# Patient Record
Sex: Female | Born: 1970 | Race: White | Hispanic: No | Marital: Married | Smoking: Former smoker
Health system: Southern US, Community
[De-identification: ages and names within clinical notes are randomized; demographics above are authoritative.]

## PROBLEM LIST (undated history)

## (undated) DIAGNOSIS — R002 Palpitations: Secondary | ICD-10-CM

## (undated) DIAGNOSIS — I10 Essential (primary) hypertension: Secondary | ICD-10-CM

## (undated) DIAGNOSIS — I340 Nonrheumatic mitral (valve) insufficiency: Secondary | ICD-10-CM

## (undated) DIAGNOSIS — K4090 Unilateral inguinal hernia, without obstruction or gangrene, not specified as recurrent: Secondary | ICD-10-CM

## (undated) DIAGNOSIS — I499 Cardiac arrhythmia, unspecified: Secondary | ICD-10-CM

## (undated) HISTORY — DX: Cardiac arrhythmia, unspecified: I49.9

## (undated) HISTORY — PX: DILATION AND CURETTAGE OF UTERUS: SHX78

## (undated) HISTORY — PX: INTRAUTERINE DEVICE (IUD) INSERTION: SHX5877

## (undated) HISTORY — PX: TONSILLECTOMY: SUR1361

---

## 2001-02-15 ENCOUNTER — Other Ambulatory Visit: Admission: RE | Admit: 2001-02-15 | Discharge: 2001-02-15 | Payer: Self-pay | Admitting: Obstetrics & Gynecology

## 2001-09-01 ENCOUNTER — Inpatient Hospital Stay (HOSPITAL_COMMUNITY): Admission: AD | Admit: 2001-09-01 | Discharge: 2001-09-05 | Payer: Self-pay | Admitting: Obstetrics and Gynecology

## 2001-09-01 ENCOUNTER — Encounter (INDEPENDENT_AMBULATORY_CARE_PROVIDER_SITE_OTHER): Payer: Self-pay

## 2001-10-14 ENCOUNTER — Other Ambulatory Visit: Admission: RE | Admit: 2001-10-14 | Discharge: 2001-10-14 | Payer: Self-pay | Admitting: Obstetrics & Gynecology

## 2002-09-19 ENCOUNTER — Other Ambulatory Visit: Admission: RE | Admit: 2002-09-19 | Discharge: 2002-09-19 | Payer: Self-pay | Admitting: Obstetrics & Gynecology

## 2003-04-06 ENCOUNTER — Inpatient Hospital Stay (HOSPITAL_COMMUNITY): Admission: AD | Admit: 2003-04-06 | Discharge: 2003-04-09 | Payer: Self-pay | Admitting: Obstetrics & Gynecology

## 2003-05-04 ENCOUNTER — Other Ambulatory Visit: Admission: RE | Admit: 2003-05-04 | Discharge: 2003-05-04 | Payer: Self-pay | Admitting: Obstetrics & Gynecology

## 2005-12-25 ENCOUNTER — Ambulatory Visit (HOSPITAL_COMMUNITY): Admission: RE | Admit: 2005-12-25 | Discharge: 2005-12-25 | Payer: Self-pay | Admitting: Obstetrics & Gynecology

## 2008-06-24 HISTORY — PX: OTHER SURGICAL HISTORY: SHX169

## 2009-11-06 HISTORY — PX: MYOMECTOMY: SHX85

## 2010-07-12 ENCOUNTER — Ambulatory Visit (HOSPITAL_COMMUNITY): Admission: RE | Admit: 2010-07-12 | Discharge: 2010-07-12 | Payer: Self-pay | Admitting: Obstetrics & Gynecology

## 2010-08-16 ENCOUNTER — Ambulatory Visit (HOSPITAL_COMMUNITY): Admission: RE | Admit: 2010-08-16 | Discharge: 2010-08-16 | Payer: Self-pay | Admitting: Obstetrics & Gynecology

## 2011-01-19 LAB — CBC
HCT: 36.6 % (ref 36.0–46.0)
RBC: 3.96 MIL/uL (ref 3.87–5.11)
RDW: 12.1 % (ref 11.5–15.5)
WBC: 8.6 10*3/uL (ref 4.0–10.5)

## 2011-01-19 LAB — BASIC METABOLIC PANEL
CO2: 21 mEq/L (ref 19–32)
Calcium: 7.6 mg/dL — ABNORMAL LOW (ref 8.4–10.5)
GFR calc Af Amer: >60 mL/min (ref >60)
GFR calc non Af Amer: >60 mL/min (ref >60)
Sodium: 137 mEq/L (ref 135–145)

## 2011-03-24 NOTE — H&P (Signed)
NAME:  Natalie Hooper, BARBATO                           ACCOUNT NO.:  [id number]   MEDICAL RECORD NO.:  [id number]                   PATIENT TYPE:  INP   LOCATION:  9167                                 FACILITY:  [facility]   PHYSICIAN:  Genia Del, M.D.             DATE OF BIRTH:  May 26, 1971   DATE OF ADMISSION:  04/06/2003  DATE OF DISCHARGE:                                HISTORY & PHYSICAL   HISTORY AND PHYSICAL EXAMINATION:  Mrs. Sutphin is a 40 year old, G2, P36,  estimated date of delivery April 16, 2003, at 38 weeks and 4 days gestation.   REASON FOR ADMISSION:  Regular uterine contractions every three to five  minutes starting this evening.   HISTORY OF PRESENT ILLNESS:  Increasing uterine contractions, questionable  fluid leak with Nitrazine negative and fern negative at maternity  admissions.  Fetal movements positive.  No vaginal bleeding.  No PIH  symptoms.   PAST MEDICAL HISTORY:  Mild migraines.   PAST SURGICAL HISTORY:  Negative.   PAST OBSTETRICAL HISTORY:  In October of 2002, 35+ weeks premature rupture  of membranes, spontaneous vaginal delivery of a baby boy, 5 pounds 10  ounces, no complications.   ALLERGIES:  No known drug allergies.   MEDICATIONS:  Prenatal vitamins.   FAMILY HISTORY:  Positive for cardiovascular disease, chronic hypertension,  and diabetes mellitus.   SOCIAL HISTORY:  Married.  Nonsmoker.   HISTORY OF PRESENT PREGNANCY:  The first trimester was normal.  Labs showed  a hemoglobin of 12.7 and platelets 258.  Blood type Rh O positive.  Antibodies negative.  RPR nonreactive.  HBSAG negative.  HIV nonreactive.  Rubella titer is immune.  GC and chlamydia negative.  Pap test normal.  At  the end of the second trimester, the triple test showed an increased alpha-  fetoprotein.  A level 2 ultrasound review of anatomy was within normal  limits.  The patient declined amniocentesis.  In the third trimester, one-  hour GTT was within normal limits.   Ultrasound for interval growth showed a  65th percentile at 31+ weeks.  NSTs were done each visit and were reactive  for unexplained increased alpha-fetoprotein.  The patient had preterm  uterine contractions and was put on relative bed rest after 33+ weeks.  Betamethasone was given.  Fetal fibronectins were negative.  Group B  Streptococcus was negative.  Blood pressures were borderline at the end of  pregnancy in the 130s/80s.  No evidence of preeclampsia.  Urine was negative  for protein.   REVIEW OF SYSTEMS:  CONSTITUTIONAL:  Negative.  HEENT:  Negative.  CARDIOVASCULAR:  Negative.  RESPIRATORY:  Negative.  GASTROINTESTINAL:  Negative.  GENITOURINARY:  Negative.  NEUROLOGIC:  Negative.  DERMATOLOGIC:  Negative.  ENDOCRINE:  Negative.   PHYSICAL EXAMINATION:  GENERAL APPEARANCE:  No apparent distress.  Mild pain  with uterine contractions.  VITAL SIGNS:  Blood pressure around 130s/low  90s, afebrile, respiratory rate  20, pulse regular and normal.  LUNGS:  Clear bilaterally.  HEART:  Regular cardiac rhythm.  ABDOMEN:  Gravid.  Cephalic presentation.  PELVIC:  3 cm, 70%, vertex, -1, fixed.  Membranes intact.  EXTREMITIES:  Lower limbs with no edema.  NEUROLOGIC:  DTRs 2/4 bilaterally.  No clonus.   MONITORING:  Fetal heart rate 140s with good accelerations and no  decelerations.  Uterine contractions every three to four minutes, mild,  lasting 50 seconds.   IMPRESSION:  1. Gravida 2, para 1 at 38+ weeks gestation.  2. Group B Streptococcus negative.  3. Fetal well being reassuring.  4. In early spontaneous labor.   PLAN:  1. Admit to labor and delivery.  2. Artificial rupture of membranes.  3. Monitoring.  4. Expectant management towards probable vaginal delivery.                                               Genia Del, M.D.    ML/MEDQ  D:  04/06/2003  T:  04/06/2003  Job:  [id number]

## 2011-03-24 NOTE — H&P (Signed)
[facility]  Patient:    Natalie Hooper, Natalie Hooper Visit Number: [id number] MRN: [id number]          Service Type: Attending:  Silverio Lay, M.D. Dictated by:   Silverio Lay, M.D. Adm. Date:  09/02/01                           History and Physical  REASON FOR ADMISSION:         Intrauterine pregnancy at 35 weeks and 4 days with spontaneous rupture of membranes.  HISTORY OF PRESENT ILLNESS:   This is a 40 year old married white female, gravida 1, para 0, with a due date of October 03, 2001, being admitted at 35 weeks and 4 days with spontaneous rupture of membranes of clear fluid on September 01, 2001 at 11 oclock p.m.  She denies any uterine contractions, reports good fetal activity, denies any bleeding and denies any pregnancy-induced hypertension symptoms.  Prenatal course reveals blood type O-positive, RPR nonreactive, rubella immune, HBsAg negative, HIV nonreactive, gonorrhea negative, Chlamydia negative, Pap smear within normal limits. Sixteen-week AFP was elevated, borderline, at 2.34.  Twenty-week ultrasound revealed a normal anatomy survey, normal cervical length and posterior placenta.  One-hour glucose tolerance test was within normal limits.  Prenatal course was otherwise uneventful.  ALLERGIES:                    No known drug allergies.  PAST MEDICAL HISTORY:         History of migraines.  T&A at age 19.  FAMILY HISTORY:               Maternal grandmother, maternal grandfather, paternal grandfather and mother with hypertension.  Maternal grandfather with diabetes.  Maternal aunt with diabetes.  SOCIAL HISTORY:               Married, nonsmoker, works as a Production designer, theatre/television/film.  PHYSICAL EXAMINATION:  VITAL SIGNS:                  Blood pressure is 130s-140s/95.  HEENT:                        Negative.  LUNGS:                        Clear.  HEART:                        Normal.  ABDOMEN:                      Gravid, nontender.  Fundal height  adequate for dates.  Fetal heart rate tracings are reactive.  Contractions are irregular and not perceived by the patient.  PELVIC:                       Vaginal exam by admitting nurse is fingertip, 70% effaced, vertex -1.  EXTREMITIES:                  No edema.  Deep tendon reflexes 2/4, no clonus.  ASSESSMENT:                   Intrauterine pregnancy at 35 weeks and 4 days with spontaneous rupture of membranes, group B streptococcus unknown, pending, no evidence of active labor, elevation of high blood pressure without symptoms of pregnancy-induced  hypertension.  PLAN:                         Patient will be admitted to labor and delivery. I have reviewed with patient two options as far as expectant management with risks and benefits and induction of labor with risks and benefits.  Patient is well aware that group B strep is unknown and desires expectant management.  PIH labs will be drawn. Dictated by:   Silverio Lay, M.D. Attending:  Silverio Lay, M.D. DD:  09/02/01 TD:  09/02/01 Job: [id number]

## 2011-05-18 ENCOUNTER — Ambulatory Visit: Payer: Self-pay | Admitting: Cardiovascular Disease

## 2011-05-22 HISTORY — PX: DOBUTAMINE STRESS ECHO: SHX5426

## 2011-07-31 ENCOUNTER — Other Ambulatory Visit (HOSPITAL_COMMUNITY): Payer: Self-pay | Admitting: Obstetrics & Gynecology

## 2011-07-31 DIAGNOSIS — Z1231 Encounter for screening mammogram for malignant neoplasm of breast: Secondary | ICD-10-CM

## 2011-09-14 ENCOUNTER — Ambulatory Visit (HOSPITAL_COMMUNITY)
Admission: RE | Admit: 2011-09-14 | Discharge: 2011-09-14 | Disposition: A | Discharge status: Home / Self Care | Payer: BC Managed Care – PPO | Source: Ambulatory Visit | Attending: Obstetrics & Gynecology | Admitting: Obstetrics & Gynecology

## 2011-09-14 DIAGNOSIS — Z1231 Encounter for screening mammogram for malignant neoplasm of breast: Secondary | ICD-10-CM

## 2011-10-03 HISTORY — PX: DOPPLER ECHOCARDIOGRAPHY: SHX263

## 2013-03-12 ENCOUNTER — Other Ambulatory Visit (HOSPITAL_COMMUNITY): Payer: Self-pay | Admitting: Obstetrics & Gynecology

## 2013-03-12 DIAGNOSIS — Z1231 Encounter for screening mammogram for malignant neoplasm of breast: Secondary | ICD-10-CM

## 2013-04-02 ENCOUNTER — Ambulatory Visit (HOSPITAL_COMMUNITY)
Admission: RE | Admit: 2013-04-02 | Discharge: 2013-04-02 | Disposition: A | Discharge status: Home / Self Care | Payer: BC Managed Care – PPO | Source: Ambulatory Visit | Attending: Obstetrics & Gynecology | Admitting: Obstetrics & Gynecology

## 2013-04-02 DIAGNOSIS — Z1231 Encounter for screening mammogram for malignant neoplasm of breast: Secondary | ICD-10-CM

## 2013-11-21 ENCOUNTER — Other Ambulatory Visit: Payer: Self-pay | Admitting: Cardiovascular Disease

## 2013-12-10 ENCOUNTER — Encounter: Payer: Self-pay | Admitting: Cardiovascular Disease

## 2013-12-10 ENCOUNTER — Ambulatory Visit (INDEPENDENT_AMBULATORY_CARE_PROVIDER_SITE_OTHER): Payer: BC Managed Care – PPO | Admitting: Cardiovascular Disease

## 2013-12-10 VITALS — BP 128/86 | HR 68 | Ht 62.0 in | Wt 115.5 lb

## 2013-12-10 DIAGNOSIS — R5383 Other fatigue: Secondary | ICD-10-CM

## 2013-12-10 DIAGNOSIS — R002 Palpitations: Secondary | ICD-10-CM

## 2013-12-10 DIAGNOSIS — E782 Mixed hyperlipidemia: Secondary | ICD-10-CM

## 2013-12-10 DIAGNOSIS — Z79899 Other long term (current) drug therapy: Secondary | ICD-10-CM

## 2013-12-10 DIAGNOSIS — I059 Rheumatic mitral valve disease, unspecified: Secondary | ICD-10-CM

## 2013-12-10 DIAGNOSIS — R5381 Other malaise: Secondary | ICD-10-CM

## 2013-12-10 DIAGNOSIS — I340 Nonrheumatic mitral (valve) insufficiency: Secondary | ICD-10-CM

## 2013-12-10 DIAGNOSIS — Z8249 Family history of ischemic heart disease and other diseases of the circulatory system: Secondary | ICD-10-CM

## 2013-12-10 NOTE — Patient Instructions (Signed)
Your physician recommends that you schedule a follow-up appointment in: 1 year  Your physician recommends that you return for lab work CBC, CMP, TSH, FASTING LIPIDS

## 2013-12-10 NOTE — Progress Notes (Signed)
Patient ID: Natalie Hooper, female   DOB: 03-Feb-1971, 43 y.o.   MRN: [id number]     HPI: Natalie Hooper is a 43 y.o. female who presents for one-year cardiology evaluation.  As the MRN is very pleasant 43 year old female who is the daughter of my patient Natalie Hooper. I last saw Natalie Hooper one year ago. History of palpitations an echo Doppler study November 2012 showed normal systolic and diastolic function. She had flat mitral valve coaptation without definitive prolapse and mild mitral regurgitation with trivial tricuspid regurgitation. She had normal pulmonary pressures.  Over the past year, she has been on metoprolol succinate at 37.5 mg daily. This has essentially controlled her palpitations. There is a very rare instance where she does note an occasional palpitation and she has taken an extra half of this metoprolol.  She continues to be active. She denies chest pain. She denies shortness of breath. She denies presyncope or syncope. She exercises regularly. She voids caffeine as possible.  No past medical history on file.  No past surgical history on file.  No Known Allergies  Current Outpatient Prescriptions  Medication Sig Dispense Refill  . acetaminophen (TYLENOL) 325 MG tablet Take 650 mg by mouth as needed.      Marland Kitchen ibuprofen (ADVIL,MOTRIN) 200 MG tablet Take 200 mg by mouth as needed.      . metoprolol succinate (TOPROL-XL) 25 MG 24 hr tablet Take 1.5 tablets (37.5 mg total) by mouth daily. Keep appointment for refills.  135 tablet  0   No current facility-administered medications for this visit.    History   Social History  . Marital Status: Married    Spouse Name: N/A    Number of Children: N/A  . Years of Education: N/A   Occupational History  . Not on file.   Social History Main Topics  . Smoking status: Former Smoker -- 5 years    Types: Cigarettes    Quit date: 12/10/1996  . Smokeless tobacco: Never Used  . Alcohol Use: Yes     Comment: occas  . Drug Use: No    . Sexual Activity: Not on file   Other Topics Concern  . Not on file   Social History Narrative  . No narrative on file   Socially she is married and has 2 children, ages 56 and 1. There is no tobacco use. She does drink occasional alcohol.  Family History  Problem Relation Age of Onset  . Heart disease Mother 58  . Hypertension Maternal Grandmother   . Hypertension Maternal Grandfather   . Diabetes Maternal Grandfather   . Heart disease Maternal Grandfather 55    ROS is negative for fevers, chills or night sweats. She denies changes in vision or hearing. There is no lymphadenopathy. She denies wheezing, cough, increased sputum production. She denies recent presyncope. She does not wear palpitation. There is no chest pain. She denies nausea vomiting diarrhea. She denies GU symptoms. She denies blood in stool. There are no myalgias. She denies edema. There is no diabetes or thyroid abnormality. She denies issues with sleep.  Other comprehensive 14 point system review is negative.  PE BP 128/86  Pulse 68  Ht 5' 2"  (1.575 m)  Wt 115 lb 8 oz (52.39 kg)  BMI 21.12 kg/m2  General: Alert, oriented, no distress.  Skin: normal turgor, no rashes HEENT: Normocephalic, atraumatic. Pupils round and reactive; sclera anicteric;no lid lag. Extraocular muscles intact. Nose without nasal septal hypertrophy Mouth/Parynx benign; Mallinpatti scale 2  Neck: No JVD, no carotid bruits; normal carotid upstroke Lungs: clear to ausculatation and percussion; no wheezing or rales Chest wall: no tenderness to palpitation Heart: RRR, s1 s2 normal  1/6 systolic murmur intermittent systolic click. No S3 or S4 gallop. No rub, thrill, or heave. Abdomen: soft, nontender; no hepatosplenomehaly, BS+; abdominal aorta nontender and not dilated by palpation. Back: no CVA tenderness Pulses 2+ Extremities: no clubbing cyanosis or edema, Homan's sign negative  Neurologic: grossly nonfocal; cranial nerves grossly  normal. Psychologic: normal affect and mood.  ECG (independently read by me): Normal sinus rhythm at 68 beats per minute. Normal intervals. No ectopy  LABS:  BMET    Component Value Date/Time   NA 137 07/12/2010 1136   K 3.7 07/12/2010 1136   CL 110 07/12/2010 1136   CO2 21 07/12/2010 1136   GLUCOSE 81 07/12/2010 1136   BUN 8 07/12/2010 1136   CREATININE 0.66 07/12/2010 1136   CALCIUM 7.6* 07/12/2010 1136   GFRNONAA >60 07/12/2010 1136   GFRAA  Value: >60        The eGFR has been calculated using the MDRD equation. This calculation has not been validated in all clinical situations. eGFR's persistently <60 mL/min signify possible Chronic Kidney Disease. 07/12/2010 1136     Hepatic Function Panel  No results found for this basename: prot, albumin, ast, alt, alkphos, bilitot, bilidir, ibili     CBC    Component Value Date/Time   WBC 8.6 07/12/2010 0905   RBC 3.96 07/12/2010 0905   HGB 12.6 07/12/2010 0905   HCT 36.6 07/12/2010 0905   PLT 318 07/12/2010 0905   MCV 92.5 07/12/2010 0905   MCH 31.8 07/12/2010 0905   MCHC 34.3 07/12/2010 0905   RDW 12.1 07/12/2010 0905     BNP No results found for this basename: probnp    Lipid Panel  No results found for this basename: chol, trig, hdl, cholhdl, vldl, ldlcalc     RADIOLOGY: No results found.    ASSESSMENT AND PLAN: Natalie Hooper is a very pleasant 43 year old female who has documented normal systolic and diastolic function with flat mitral valve closure and mild mitral regurgitation. She was not found to have definitive mitral valve prolapse. She does have an intermittent click on exam. She has done well on her current dose of metoprolol succinate at 37.5 mg. I advised her that she can increase this to 50 mg if she does note more frequent palpitations. She's not had blood work done since 2012. We will check a complete set of blood work in the fasting state. This will be done in [place]. If abnormalities are present we will contact her. Otherwise, so  she remains stable will see her in one year for cardiology reevaluation.     Troy Sine, MD, [facility]  12/10/2013 2:25 PM

## 2014-03-03 ENCOUNTER — Other Ambulatory Visit: Payer: Self-pay | Admitting: *Deleted

## 2014-03-03 MED ORDER — METOPROLOL SUCCINATE ER 25 MG PO TB24: 37.5000 mg | ORAL_TABLET | Freq: Every day | ORAL | Status: DC

## 2014-03-03 NOTE — Telephone Encounter (Signed)
Rx was sent to pharmacy electronically. 

## 2014-06-19 ENCOUNTER — Other Ambulatory Visit (HOSPITAL_COMMUNITY): Payer: Self-pay | Admitting: Obstetrics & Gynecology

## 2014-06-19 DIAGNOSIS — Z1231 Encounter for screening mammogram for malignant neoplasm of breast: Secondary | ICD-10-CM

## 2014-07-21 ENCOUNTER — Ambulatory Visit (HOSPITAL_COMMUNITY): Payer: BC Managed Care – PPO

## 2014-07-21 ENCOUNTER — Ambulatory Visit (HOSPITAL_COMMUNITY)
Admission: RE | Admit: 2014-07-21 | Discharge: 2014-07-21 | Disposition: A | Discharge status: Home / Self Care | Payer: BC Managed Care – PPO | Source: Ambulatory Visit | Attending: Obstetrics & Gynecology | Admitting: Obstetrics & Gynecology

## 2014-07-21 DIAGNOSIS — Z1231 Encounter for screening mammogram for malignant neoplasm of breast: Secondary | ICD-10-CM | POA: Diagnosis present

## 2014-12-28 ENCOUNTER — Encounter: Payer: Self-pay | Admitting: Cardiovascular Disease

## 2014-12-29 ENCOUNTER — Ambulatory Visit (INDEPENDENT_AMBULATORY_CARE_PROVIDER_SITE_OTHER): Payer: BC Managed Care – PPO | Admitting: Cardiovascular Disease

## 2014-12-29 ENCOUNTER — Encounter: Payer: Self-pay | Admitting: Cardiovascular Disease

## 2014-12-29 VITALS — BP 118/80 | HR 65 | Ht 62.0 in | Wt 123.6 lb

## 2014-12-29 DIAGNOSIS — Z8249 Family history of ischemic heart disease and other diseases of the circulatory system: Secondary | ICD-10-CM

## 2014-12-29 DIAGNOSIS — R002 Palpitations: Secondary | ICD-10-CM

## 2014-12-29 DIAGNOSIS — I340 Nonrheumatic mitral (valve) insufficiency: Secondary | ICD-10-CM

## 2014-12-29 DIAGNOSIS — I341 Nonrheumatic mitral (valve) prolapse: Secondary | ICD-10-CM

## 2014-12-29 NOTE — Patient Instructions (Signed)
Your physician has requested that you have an echocardiogram. Echocardiography is a painless test that uses sound waves to create images of your heart. It provides your doctor with information about the size and shape of your heart and how well your heart's chambers and valves are working. This procedure takes approximately one hour. There are no restrictions for this procedure. This will be done in  1 year.   Your physician wants you to follow-up in: 1 year or sooner if needed with Dr. Claiborne Billings. You will receive a reminder letter in the mail two months in advance. If you don't receive a letter, please call our office to schedule the follow-up appointment.

## 2014-12-29 NOTE — Progress Notes (Signed)
Patient ID: [name], female   DOB: December 11, 1970, 44 y.o.   MRN: [id number]     HPI: Natalie Hooper is a 44 y.o. female who presents for one-year cardiology evaluation. She is the daughter of my patient Natalie Hooper.  Natalie Hooper has a history of palpitations.  An echo Doppler study November 2012 showed normal systolic and diastolic function. She had flat mitral valve coaptation without definitive prolapse and mild mitral regurgitation with trivial tricuspid regurgitation. She had normal pulmonary pressures.  She has been on metoprolol succinate at 37.5 mg daily which has essentially controlled her palpitations. There is a very rare instance where she does note an occasional palpitation and she has taken an extra half of this metoprolol.  This usually occurs when she is resting under periods of increased stress.  She continues to be active.  She exercises daily.  She denies chest pain. She denies shortness of breath. She denies presyncope or syncope. She exercises regularly. She avoids caffeine as possible.  She has rare episodes of GERD for which he takes omeprazole with relief.  She tells me that she had a complete set of laboratory done by her primary physician, Natalie Hooper in [place] in the fall of 2015.  She presents for one-year evaluation.  Past Medical History  Diagnosis Date  . Arrhythmia     Hx of Palp.    Past Surgical History  Procedure Laterality Date  . Doppler echocardiography  10/03/2011    EF = >55%,mild mitral regurg  . Dobutamine stress echo  05/22/2011    Normal treadmill,normal stress echo  . Event monitor  06/24/2008    NSR,Sinus Tach    No Known Allergies  Current Outpatient Prescriptions  Medication Sig Dispense Refill  . acetaminophen (TYLENOL) 325 MG tablet Take 650 mg by mouth as needed.    Marland Kitchen ibuprofen (ADVIL,MOTRIN) 200 MG tablet Take 200 mg by mouth as needed.    . metoprolol succinate (TOPROL-XL) 25 MG 24 hr tablet Take 1.5 tablets (37.5 mg  total) by mouth daily. 135 tablet 3  . omeprazole (PRILOSEC) 20 MG capsule Take 1 capsule by mouth as needed.     No current facility-administered medications for this visit.    History   Social History  . Marital Status: Married    Spouse Name: N/A  . Number of Children: N/A  . Years of Education: N/A   Occupational History  . Not on file.   Social History Main Topics  . Smoking status: Former Smoker -- 5 years    Types: Cigarettes    Quit date: 12/10/1996  . Smokeless tobacco: Never Used  . Alcohol Use: Yes     Comment: occas  . Drug Use: No  . Sexual Activity: Not on file   Other Topics Concern  . Not on file   Social History Narrative   Socially she is married and has 2 children, ages 30 and 86. There is no tobacco use. She does drink occasional alcohol.  Family History  Problem Relation Age of Onset  . Heart disease Mother 39  . Hypertension Maternal Grandmother   . Hypertension Maternal Grandfather   . Diabetes Maternal Grandfather   . Heart disease Maternal Grandfather 55   ROS General: Negative; No fevers, chills, or night sweats;  HEENT: Positive for rare nosebleeds; No changes in vision or hearing, sinus congestion, difficulty swallowing Pulmonary: Negative; No cough, wheezing, shortness of breath, hemoptysis Cardiovascular: Negative; No chest pain, presyncope, syncope, palpitations GI:  Positive for mild GERD; No nausea, vomiting, diarrhea, or abdominal pain GU: Negative; No dysuria, hematuria, or difficulty voiding Musculoskeletal: Negative; no myalgias, joint pain, or weakness Hematologic/Oncology: Negative; no easy bruising, bleeding Endocrine: Negative; no heat/cold intolerance; no diabetes Neuro: Negative; no changes in balance, headaches Skin: Negative; No rashes or skin lesions Psychiatric: Negative; No behavioral problems, depression Sleep: Negative; No snoring, daytime sleepiness, hypersomnolence, bruxism, restless legs, hypnogognic  hallucinations, no cataplexy Other comprehensive 14 point system review is negative.   PE BP 118/80 mmHg  Pulse 65  Ht _0  (1.575 m)  Wt 123 lb 9.6 oz (56.065 kg)  BMI 22.60 kg/m2  General: Alert, oriented, no distress.  Skin: normal turgor, no rashes HEENT: Normocephalic, atraumatic. Pupils round and reactive; sclera anicteric;no lid lag. Extraocular muscles intact. Nose without nasal septal hypertrophy Mouth/Parynx benign; Mallinpatti scale 2 Neck: No JVD, no carotid bruits; normal carotid upstroke Lungs: clear to ausculatation and percussion; no wheezing or rales Chest wall: no tenderness to palpitation Heart: RRR, s1 s2 normal  1/6 systolic murmur intermittent systolic click. No S3 or S4 gallop. No rub, thrill, or heave. Abdomen: soft, nontender; no hepatosplenomehaly, BS+; abdominal aorta nontender and not dilated by palpation. Back: no CVA tenderness Pulses 2+ Extremities: no clubbing cyanosis or edema, Homan's sign negative  Neurologic: grossly nonfocal; cranial nerves grossly normal. Psychologic: normal affect and mood.  ECG (independently read by me): Normal sinus rhythm at 65 bpm.  No ectopy.  Normal intervals.  Mild RV conduction delay.  ECG (independently read by me): Normal sinus rhythm at 68 beats per minute. Normal intervals. No ectopy  LABS:  BMET    Component Value Date/Time   NA 137 07/12/2010 1136   K 3.7 07/12/2010 1136   CL 110 07/12/2010 1136   CO2 21 07/12/2010 1136   GLUCOSE 81 07/12/2010 1136   BUN 8 07/12/2010 1136   CREATININE 0.66 07/12/2010 1136   CALCIUM 7.6* 07/12/2010 1136   GFRNONAA >60 07/12/2010 1136   GFRAA  07/12/2010 1136    >60        The eGFR has been calculated using the MDRD equation. This calculation has not been validated in all clinical situations. eGFR's persistently <60 mL/min signify possible Chronic Kidney Disease.     Hepatic Function Panel  No results found for: PROT   CBC    Component Value Date/Time     WBC 8.6 07/12/2010 0905   RBC 3.96 07/12/2010 0905   HGB 12.6 07/12/2010 0905   HCT 36.6 07/12/2010 0905   PLT 318 07/12/2010 0905   MCV 92.5 07/12/2010 0905   MCH 31.8 07/12/2010 0905   MCHC 34.3 07/12/2010 0905   RDW 12.1 07/12/2010 0905     BNP No results found for: PROBNP  Lipid Panel  No results found for: CHOL   RADIOLOGY: No results found.    ASSESSMENT AND PLAN: Natalie Hooper is a very pleasant 44 year old female who has documented normal systolic and diastolic function with flat mitral valve closure and mild mitral regurgitation by an echo Doppler study in 2012.Marland Kitchen She was not found to have definitive mitral valve prolapse. She does have an intermittent click on exam and a faint systolic murmur suggestive of MR.  She has done well on her current dose of metoprolol succinate at 37.5 mg. I advised her that she can increase this to 50 mg if she does note more frequent palpitations.  She has had recent laboratory by Dr. Kary Kos.  I will try to  obtain these results.  Clinically, she is stable.  I will see her in one year for follow-up evaluation.  Prior to that office visit.  I am recommending that she undergo a five-year follow-up echo Doppler study to reassess her systolic and diastolic function as well as valvular architecture.  I'll see her in the office in follow-up and further recommendations will be made at that time.    Troy Sine, MD, [facility]  12/29/2014 2:15 PM

## 2015-01-01 ENCOUNTER — Encounter: Payer: Self-pay | Admitting: Cardiovascular Disease

## 2015-02-26 ENCOUNTER — Other Ambulatory Visit: Payer: Self-pay | Admitting: Cardiovascular Disease

## 2015-02-26 NOTE — Telephone Encounter (Signed)
Rx(s) sent to pharmacy electronically.  

## 2016-02-28 ENCOUNTER — Other Ambulatory Visit: Payer: Self-pay | Admitting: Cardiovascular Disease

## 2016-02-29 NOTE — Telephone Encounter (Signed)
Rx(s) sent to pharmacy electronically.  

## 2016-03-02 ENCOUNTER — Telehealth: Payer: Self-pay | Admitting: Cardiovascular Disease

## 2016-03-02 NOTE — Telephone Encounter (Signed)
°*  STAT* If patient is at the pharmacy, call can be transferred to refill team.   1. Which medications need to be refilled? (please list name of each medication and dose if known) Metoprolol Succinate  2. Which pharmacy/location (including street and city if local pharmacy) is medication to be sent to? [facility] on [address] in [place]   3. Do they need a 30 day or 90 day supply? [place]

## 2016-03-28 ENCOUNTER — Other Ambulatory Visit: Payer: Self-pay | Admitting: Cardiovascular Disease

## 2016-03-29 ENCOUNTER — Other Ambulatory Visit: Payer: Self-pay

## 2016-03-29 MED ORDER — METOPROLOL SUCCINATE ER 25 MG PO TB24: ORAL_TABLET | ORAL | Status: DC

## 2016-07-03 ENCOUNTER — Telehealth: Payer: Self-pay | Admitting: Cardiovascular Disease

## 2016-07-03 DIAGNOSIS — I341 Nonrheumatic mitral (valve) prolapse: Secondary | ICD-10-CM

## 2016-07-03 DIAGNOSIS — Z8249 Family history of ischemic heart disease and other diseases of the circulatory system: Secondary | ICD-10-CM

## 2016-07-03 NOTE — Telephone Encounter (Signed)
Returned call to patient.She stated she wanted to have lab work done before her appointment with Dr.Kelly.Lab orders faxed to her at fax # [phone].

## 2016-07-03 NOTE — Telephone Encounter (Signed)
Pt scheduled to see Dr Claiborne Billings on 07-12-16. She would like to have lab work before that appointment. Would you fax her a lab order so she can get her lab work in [place] before next Wednesday. Where can she go in [place]? Please fax her lab order to her at [phone],

## 2016-07-06 ENCOUNTER — Other Ambulatory Visit: Payer: Self-pay | Admitting: Cardiovascular Disease

## 2016-07-07 LAB — CBC WITH DIFFERENTIAL/PLATELET
BASOS ABS: 0 10*3/uL (ref 0.0–0.2)
BASOS: 0 %
EOS (ABSOLUTE): 0.1 10*3/uL (ref 0.0–0.4)
Eos: 2 %
Hematocrit: 38.9 % (ref 34.0–46.6)
Hemoglobin: 13.5 g/dL (ref 11.1–15.9)
IMMATURE GRANS (ABS): 0 10*3/uL (ref 0.0–0.1)
IMMATURE GRANULOCYTES: 0 %
LYMPHS: 36 %
Lymphocytes Absolute: 2.3 10*3/uL (ref 0.7–3.1)
MCH: 31.4 pg (ref 26.6–33.0)
MCHC: 34.7 g/dL (ref 31.5–35.7)
MCV: 91 fL (ref 79–97)
MONOS ABS: 0.6 10*3/uL (ref 0.1–0.9)
Monocytes: 9 %
NEUTROS PCT: 53 %
Neutrophils Absolute: 3.3 10*3/uL (ref 1.4–7.0)
PLATELETS: 278 10*3/uL (ref 150–379)
RBC: 4.3 x10E6/uL (ref 3.77–5.28)
RDW: 12.8 % (ref 12.3–15.4)
WBC: 6.3 10*3/uL (ref 3.4–10.8)

## 2016-07-07 LAB — TSH: TSH: 2.59 u[IU]/mL (ref 0.450–4.500)

## 2016-07-07 LAB — COMPREHENSIVE METABOLIC PANEL
A/G RATIO: 2 (ref 1.2–2.2)
ALT: 12 IU/L (ref 0–32)
AST: 20 IU/L (ref 0–40)
Albumin: 4.5 g/dL (ref 3.5–5.5)
Alkaline Phosphatase: 45 IU/L (ref 39–117)
BILIRUBIN TOTAL: 0.4 mg/dL (ref 0.0–1.2)
BUN/Creatinine Ratio: 20 (ref 9–23)
BUN: 15 mg/dL (ref 6–24)
CALCIUM: 9.5 mg/dL (ref 8.7–10.2)
CHLORIDE: 102 mmol/L (ref 96–106)
CO2: 23 mmol/L (ref 18–29)
Creatinine, Ser: 0.75 mg/dL (ref 0.57–1.00)
GFR calc Af Amer: 110 mL/min/{1.73_m2} (ref >59)
GFR calc non Af Amer: 95 mL/min/{1.73_m2} (ref >59)
GLUCOSE: 93 mg/dL (ref 65–99)
Globulin, Total: 2.3 g/dL (ref 1.5–4.5)
POTASSIUM: 5 mmol/L (ref 3.5–5.2)
Sodium: 140 mmol/L (ref 134–144)
TOTAL PROTEIN: 6.8 g/dL (ref 6.0–8.5)

## 2016-07-07 LAB — LIPID PANEL
Chol/HDL Ratio: 2.2 ratio units (ref 0.0–4.4)
Cholesterol, Total: 211 mg/dL — ABNORMAL HIGH (ref 100–199)
HDL: 95 mg/dL (ref >39)
LDL Calculated: 98 mg/dL (ref 0–99)
TRIGLYCERIDES: 89 mg/dL (ref 0–149)
VLDL CHOLESTEROL CAL: 18 mg/dL (ref 5–40)

## 2016-07-11 ENCOUNTER — Telehealth: Payer: Self-pay | Admitting: Cardiovascular Disease

## 2016-07-11 NOTE — Telephone Encounter (Signed)
Returned call to patient-aware lab work that was completed on 8/31 is available in chart for MD review.  Verbalized understanding.

## 2016-07-11 NOTE — Telephone Encounter (Signed)
New Message  pt call requesting to speak with RN. pt states pt had lab work complete outside the office and wants to f/u on information being recieved. please call back to discuss

## 2016-07-12 ENCOUNTER — Encounter: Payer: Self-pay | Admitting: Cardiovascular Disease

## 2016-07-12 ENCOUNTER — Ambulatory Visit (INDEPENDENT_AMBULATORY_CARE_PROVIDER_SITE_OTHER): Payer: BLUE CROSS/BLUE SHIELD | Admitting: Cardiovascular Disease

## 2016-07-12 VITALS — BP 128/84 | HR 70 | Ht 62.0 in | Wt 115.0 lb

## 2016-07-12 DIAGNOSIS — R002 Palpitations: Secondary | ICD-10-CM

## 2016-07-12 DIAGNOSIS — I340 Nonrheumatic mitral (valve) insufficiency: Secondary | ICD-10-CM | POA: Diagnosis not present

## 2016-07-12 DIAGNOSIS — Z8249 Family history of ischemic heart disease and other diseases of the circulatory system: Secondary | ICD-10-CM

## 2016-07-12 NOTE — Patient Instructions (Signed)
Your physician wants you to follow-up in: 1 year or sooner if needed. You will receive a reminder letter in the mail two months in advance. If you don't receive a letter, please call our office to schedule the follow-up appointment.   If you need a refill on your cardiac medications before your next appointment, please call your pharmacy.   

## 2016-07-13 NOTE — Progress Notes (Signed)
Patient ID: Natalie Hooper, female   DOB: [birth year], 45 y.o.   MRN: [id number]      Primary M.D.: [name]  HPI: Natalie Hooper is a 45 y.o. female who presents for a 88 month follow-up cardiology evaluation. She is the daughter of my patient Natalie Hooper.  [name] has a history of palpitations.  An echo Doppler study November 2012 showed normal systolic and diastolic function. She had flat mitral valve coaptation without definitive prolapse and mild mitral regurgitation with trivial tricuspid regurgitation. She had normal pulmonary pressures.  She has been on metoprolol succinate at 37.5 mg daily which has essentially controlled her palpitations. There is a very rare instance where she does note an occasional palpitation and she has taken an extra half of this metoprolol.  This usually occurs when she is resting under periods of increased stress.  Her children has  just restarted school are now in eighth and ninth grade.  She continues to be active.  She exercises daily.  She denies chest pain. She denies shortness of breath. She denies presyncope or syncope. She exercises regularly. She avoids caffeine as possible.  She has rare episodes of GERD for which he takes omeprazole with relief.   Past Medical History:  Diagnosis Date  . Arrhythmia    Hx of Palp.    Past Surgical History:  Procedure Laterality Date  . DOBUTAMINE STRESS ECHO  05/22/2011   Normal treadmill,normal stress echo  . DOPPLER ECHOCARDIOGRAPHY  10/03/2011   EF = >55%,mild mitral regurg  . Event Monitor  06/24/2008   NSR,Sinus Tach    No Known Allergies  Current Outpatient Prescriptions  Medication Sig Dispense Refill  . acetaminophen (TYLENOL) 325 MG tablet Take 650 mg by mouth as needed.    Marland Kitchen ibuprofen (ADVIL,MOTRIN) 200 MG tablet Take 200 mg by mouth as needed.    . metoprolol succinate (TOPROL-XL) 25 MG 24 hr tablet TAKE 1 & 1/2 TABLETS BY MOUTH DAILY. 45 tablet 4  . omeprazole (PRILOSEC) 20 MG  capsule Take 1 capsule by mouth as needed.     No current facility-administered medications for this visit.     Social History   Social History  . Marital status: Married    Spouse name: N/A  . Number of children: N/A  . Years of education: N/A   Occupational History  . Not on file.   Social History Main Topics  . Smoking status: Former Smoker    Years: 5.00    Types: Cigarettes    Quit date: 12/10/1996  . Smokeless tobacco: Never Used  . Alcohol use Yes     Comment: occas  . Drug use: No  . Sexual activity: Not on file   Other Topics Concern  . Not on file   Social History Narrative  . No narrative on file   Socially she is married and has 2 children, ages 82 and 18. There is no tobacco use. She does drink occasional alcohol.  Family History  Problem Relation Age of Onset  . Heart disease Mother 30  . Hypertension Maternal Grandmother   . Hypertension Maternal Grandfather   . Diabetes Maternal Grandfather   . Heart disease Maternal Grandfather 55   ROS General: Negative; No fevers, chills, or night sweats;  HEENT: Positive for rare nosebleeds; No changes in vision or hearing, sinus congestion, difficulty swallowing Pulmonary: Negative; No cough, wheezing, shortness of breath, hemoptysis Cardiovascular: Negative; No chest pain, presyncope, syncope, palpitations GI: Positive for  mild GERD; No nausea, vomiting, diarrhea, or abdominal pain GU: Negative; No dysuria, hematuria, or difficulty voiding Musculoskeletal: Negative; no myalgias, joint pain, or weakness Hematologic/Oncology: Negative; no easy bruising, bleeding Endocrine: Negative; no heat/cold intolerance; no diabetes Neuro: Negative; no changes in balance, headaches Skin: Negative; No rashes or skin lesions Psychiatric: Negative; No behavioral problems, depression Sleep: Negative; No snoring, daytime sleepiness, hypersomnolence, bruxism, restless legs, hypnogognic hallucinations, no cataplexy Other  comprehensive 14 point system review is negative.   PE BP 128/84   Pulse 70   Ht 5' 2"  (1.575 m)   Wt 115 lb (52.2 kg)   BMI 21.03 kg/m    Repeat blood pressure by me 132/70.  Wt Readings from Last 3 Encounters:  07/12/16 115 lb (52.2 kg)  12/29/14 123 lb 9.6 oz (56.1 kg)  12/10/13 115 lb 8 oz (52.4 kg)   General: Alert, oriented, no distress.  Skin: normal turgor, no rashes HEENT: Normocephalic, atraumatic. Pupils round and reactive; sclera anicteric;no lid lag. Extraocular muscles intact. Nose without nasal septal hypertrophy Mouth/Parynx benign; Mallinpatti scale 2 Neck: No JVD, no carotid bruits; normal carotid upstroke Lungs: clear to ausculatation and percussion; no wheezing or rales Chest wall: no tenderness to palpitation Heart: RRR, s1 s2 normal  3-7/1 systolic murmur at apex;  intermittent systolic click. No S3 or S4 gallop. No rub, thrill, or heave. Abdomen: soft, nontender; no hepatosplenomehaly, BS+; abdominal aorta nontender and not dilated by palpation. Back: no CVA tenderness Pulses 2+ Extremities: no clubbing cyanosis or edema, Homan's sign negative  Neurologic: grossly nonfocal; cranial nerves grossly normal. Psychologic: normal affect and mood.  ECG (independently read by me): Normal sinus rhythm at 70 bpm.  Mild RV conduction delay.  Normal intervals.  February 2016 ECG (independently read by me): Normal sinus rhythm at 65 bpm.  No ectopy.  Normal intervals.  Mild RV conduction delay.  February 2015 ECG (independently read by me): Normal sinus rhythm at 68 beats per minute. Normal intervals. No ectopy  LABS: BMP Latest Ref Rng & Units 07/06/2016 07/12/2010  Glucose 65 - 99 mg/dL 93 81  BUN 6 - 24 mg/dL 15 8  Creatinine 0.57 - 1.00 mg/dL 0.75 0.66  BUN/Creat Ratio 9 - 23 20 -  Sodium 134 - 144 mmol/L 140 137  Potassium 3.5 - 5.2 mmol/L 5.0 3.7  Chloride 96 - 106 mmol/L 102 110  CO2 18 - 29 mmol/L 23 21  Calcium 8.7 - 10.2 mg/dL 9.5 7.6(L)   Hepatic  Function Latest Ref Rng & Units 07/06/2016  Total Protein 6.0 - 8.5 g/dL 6.8  Albumin 3.5 - 5.5 g/dL 4.5  AST 0 - 40 IU/L 20  ALT 0 - 32 IU/L 12  Alk Phosphatase 39 - 117 IU/L 45  Total Bilirubin 0.0 - 1.2 mg/dL 0.4   CBC Latest Ref Rng & Units 07/06/2016 07/12/2010  WBC 3.4 - 10.8 x10E3/uL 6.3 8.6  Hemoglobin 12.0 - 15.0 g/dL - 12.6  Hematocrit 34.0 - 46.6 % 38.9 36.6  Platelets 150 - 379 x10E3/uL 278 318   Lab Results  Component Value Date   MCV 91 07/06/2016   MCV 92.5 07/12/2010   Lab Results  Component Value Date   TSH 2.590 07/06/2016   No results found for: HGBA1C  Lipid Panel     Component Value Date/Time   CHOL 211 (H) 07/06/2016 0803   TRIG 89 07/06/2016 0803   HDL 95 07/06/2016 0803   CHOLHDL 2.2 07/06/2016 0803   LDLCALC 98 07/06/2016 0803  RADIOLOGY: No results found.    ASSESSMENT AND PLAN: Ms. Louan Base is a very pleasant 45 year old female who has documented normal systolic and diastolic function with flat mitral valve closure and mild mitral regurgitation by an echo Doppler study in 2012. She was not found to have definitive mitral valve prolapse. She does have an intermittent click on exam and a systolic murmur suggestive of MR.  She has done well on her current dose of metoprolol succinate at 37.5 mg. I advised her that she can increase this to 50 mg if she does note more frequent palpitations.  She continues to be asymptomatic.  She exercises daily.  She denies shortness of breath.  She denies PND, orthopnea.  There is a mild RV conduction delay on ECG, which is not significant.  I reviewed her recent blood work from 07/06/2016.  She will continue her current medical regimen.  I will see her in one year or sooner if problem arise.  Troy Sine, MD, [facility]  07/13/2016 3:03 PM

## 2016-09-06 ENCOUNTER — Other Ambulatory Visit: Payer: Self-pay | Admitting: Cardiovascular Disease

## 2016-09-07 ENCOUNTER — Encounter: Payer: Self-pay | Admitting: Obstetrics & Gynecology

## 2017-02-10 ENCOUNTER — Other Ambulatory Visit: Payer: Self-pay | Admitting: Cardiovascular Disease

## 2017-02-14 ENCOUNTER — Other Ambulatory Visit: Payer: Self-pay | Admitting: Cardiovascular Disease

## 2017-02-14 NOTE — Telephone Encounter (Signed)
Rx(s) sent to pharmacy electronically.  

## 2017-08-06 ENCOUNTER — Encounter: Payer: Self-pay | Admitting: Women's Health

## 2017-08-06 ENCOUNTER — Ambulatory Visit (INDEPENDENT_AMBULATORY_CARE_PROVIDER_SITE_OTHER): Payer: BLUE CROSS/BLUE SHIELD | Admitting: Women's Health

## 2017-08-06 VITALS — BP 130/80

## 2017-08-06 DIAGNOSIS — N952 Postmenopausal atrophic vaginitis: Secondary | ICD-10-CM

## 2017-08-06 DIAGNOSIS — N898 Other specified noninflammatory disorders of vagina: Secondary | ICD-10-CM | POA: Diagnosis not present

## 2017-08-06 LAB — WET PREP FOR TRICH, YEAST, CLUE

## 2017-08-06 MED ORDER — PREMARIN 0.625 MG/GM VA CREA: 1.0000 | TOPICAL_CREAM | Freq: Every day | VAGINAL | 12 refills | Status: DC

## 2017-08-06 NOTE — Progress Notes (Signed)
Presents with complaint of vaginal irritation with burning sensation. Questions if she has a yeast infection. Has been treated with Diflucan, Flagyl, MetroGel with no relief 3 weeks ago at primary care. States symptoms of vaginal burning increasing over the last few days. Mirena IUD placed 3-4 years ago Dr. Dellis Filbert. Has annual exam scheduled next month. Denies vaginal discharge but has seen some spotting. Denies urinary symptoms, abdominal pain or fever. Occasional hot flushes.  Exam: Appears well. External genitalia erythematous, speculum exam moderate dryness, IUD strings visible, scant blood noted, wet prep negative.  Perimenopausal Vaginal atrophy  Plan: Options reviewed, will try Premarin vaginal cream 1 applicator 2-3 times weekly, continue vaginal lubricants with intercourse. Reviewed minimal systemic absorption of estrogen cream. Will evaluate at annual exam with Dr. Dellis Filbert next month or call sooner if no relief.

## 2017-08-06 NOTE — Patient Instructions (Signed)

## 2017-08-30 ENCOUNTER — Ambulatory Visit (INDEPENDENT_AMBULATORY_CARE_PROVIDER_SITE_OTHER): Payer: BLUE CROSS/BLUE SHIELD | Admitting: Cardiovascular Disease

## 2017-08-30 ENCOUNTER — Encounter: Payer: Self-pay | Admitting: Cardiovascular Disease

## 2017-08-30 VITALS — BP 131/83 | HR 64 | Ht 62.0 in | Wt 118.4 lb

## 2017-08-30 DIAGNOSIS — I340 Nonrheumatic mitral (valve) insufficiency: Secondary | ICD-10-CM

## 2017-08-30 DIAGNOSIS — K219 Gastro-esophageal reflux disease without esophagitis: Secondary | ICD-10-CM | POA: Diagnosis not present

## 2017-08-30 DIAGNOSIS — R002 Palpitations: Secondary | ICD-10-CM | POA: Diagnosis not present

## 2017-08-30 DIAGNOSIS — Z8249 Family history of ischemic heart disease and other diseases of the circulatory system: Secondary | ICD-10-CM

## 2017-08-30 MED ORDER — METOPROLOL SUCCINATE ER 25 MG PO TB24: 37.5000 mg | ORAL_TABLET | Freq: Every day | ORAL | 1 refills | Status: DC

## 2017-08-30 NOTE — Progress Notes (Signed)
Patient ID: STEPHANEY Hooper, female   DOB: 1971-08-14, 46 y.o.   MRN: [id number]      Primary M.D.: [name]  HPI: Natalie Hooper is a 46 y.o. female who presents for a 13 month follow-up cardiology evaluation. She is the daughter of my patient Natalie Hooper.  Natalie Hooper has a history of palpitations.  An echo Doppler study November 2012 showed normal systolic and diastolic function. She had flat mitral valve coaptation without definitive prolapse and mild mitral regurgitation with trivial tricuspid regurgitation. She had normal pulmonary pressures.  She has been on metoprolol succinate at 37.5 mg daily which has essentially controlled her palpitations. There is a very rare instance where she does note an occasional palpitation and she has taken an extra half of this metoprolol.  This usually occurs when she is resting under periods of increased stress.    She continues to be active.  Exercises daily and typically walks several miles.  She denies chest pain, palpitations, change in exercise tolerance, presyncope or syncope.  She has rare episodes of GERD for which he takes Prilosec as needed.  She presents for one-year follow-up evaluation.  Past Medical History:  Diagnosis Date  . Arrhythmia    Hx of Palp.    Past Surgical History:  Procedure Laterality Date  . DOBUTAMINE STRESS ECHO  05/22/2011   Normal treadmill,normal stress echo  . DOPPLER ECHOCARDIOGRAPHY  10/03/2011   EF = >55%,mild mitral regurg  . Event Monitor  06/24/2008   NSR,Sinus Tach  . MYOMECTOMY  2011    No Known Allergies  Current Outpatient Prescriptions  Medication Sig Dispense Refill  . acetaminophen (TYLENOL) 325 MG tablet Take 650 mg by mouth as needed.    . conjugated estrogens (PREMARIN) vaginal cream Place 1 Applicatorful vaginally 2 (two) times a week.    Marland Kitchen ibuprofen (ADVIL,MOTRIN) 200 MG tablet Take 200 mg by mouth as needed.    Marland Kitchen levonorgestrel (MIRENA) 20 MCG/24HR IUD 1 each by Intrauterine route  once.    . metoprolol succinate (TOPROL-XL) 25 MG 24 hr tablet Take 1.5 tablets (37.5 mg total) by mouth daily. 135 tablet 1  . omeprazole (PRILOSEC) 20 MG capsule Take 1 capsule by mouth as needed.    Marland Kitchen PREMARIN vaginal cream Place 1 Applicatorful vaginally daily. 42.5 g 12   No current facility-administered medications for this visit.     Social History   Social History  . Marital status: Married    Spouse name: N/A  . Number of children: N/A  . Years of education: N/A   Occupational History  . Not on file.   Social History Main Topics  . Smoking status: Former Smoker    Years: 5.00    Types: Cigarettes    Quit date: 12/10/1996  . Smokeless tobacco: Never Used  . Alcohol use Yes     Comment: occas  . Drug use: No  . Sexual activity: Yes     Comment: intercourse age 30, less than 5 sexual partners,des neg   Other Topics Concern  . Not on file   Social History Narrative  . No narrative on file   Socially she is married and has 2 children, ages 62 and 5. There is no tobacco use. She does drink occasional alcohol.  Family History  Problem Relation Age of Onset  . Heart disease Mother 67  . Hypertension Maternal Grandmother   . Hypertension Maternal Grandfather   . Diabetes Maternal Grandfather   . Heart  disease Maternal Grandfather 55   ROS General: Negative; No fevers, chills, or night sweats;  HEENT: Positive for rare nosebleeds; No changes in vision or hearing, sinus congestion, difficulty swallowing Pulmonary: Negative; No cough, wheezing, shortness of breath, hemoptysis Cardiovascular: Negative; No chest pain, presyncope, syncope, palpitations GI: Positive for mild GERD; No nausea, vomiting, diarrhea, or abdominal pain GU: Negative; No dysuria, hematuria, or difficulty voiding Musculoskeletal: Negative; no myalgias, joint pain, or weakness Hematologic/Oncology: Negative; no easy bruising, bleeding Endocrine: Negative; no heat/cold intolerance; no  diabetes Neuro: Negative; no changes in balance, headaches Skin: Negative; No rashes or skin lesions Psychiatric: Negative; No behavioral problems, depression Sleep: Negative; No snoring, daytime sleepiness, hypersomnolence, bruxism, restless legs, hypnogognic hallucinations, no cataplexy Other comprehensive 14 point system review is negative.   PE BP 131/83   Pulse 64   Ht 5' 2"  (1.575 m)   Wt 118 lb 6.4 oz (53.7 kg)   BMI 21.66 kg/m    Repeat blood pressure by me was 128/80  Wt Readings from Last 3 Encounters:  08/30/17 118 lb 6.4 oz (53.7 kg)  07/12/16 115 lb (52.2 kg)  12/29/14 123 lb 9.6 oz (56.1 kg)   General: Alert, oriented, no distress.  Skin: normal turgor, no rashes, warm and dry HEENT: Normocephalic, atraumatic. Pupils equal round and reactive to light; sclera anicteric; extraocular muscles intact;  Nose without nasal septal hypertrophy Mouth/Parynx benign; Mallinpatti scale 2 Neck: No JVD, no carotid bruits; normal carotid upstroke Lungs: clear to ausculatation and percussion; no wheezing or rales Chest wall: without tenderness to palpitation Heart: PMI not displaced, RRR, s1 s2 normal, 1/6 systolic murmur, no diastolic murmur, no rubs, gallops, thrills, or heaves Abdomen: soft, nontender; no hepatosplenomehaly, BS+; abdominal aorta nontender and not dilated by palpation. Back: no CVA tenderness Pulses 2+ Musculoskeletal: full range of motion, normal strength, no joint deformities Extremities: no clubbing cyanosis or edema, Homan's sign negative  Neurologic: grossly nonfocal; Cranial nerves grossly wnl Psychologic: Normal mood and affect   ECG (independently read by me): Normal sinus rhythm at 64 bpm.  September 2017 ECG (independently read by me): Normal sinus rhythm at 70 bpm.  Mild RV conduction delay.  Normal intervals.  February 2016 ECG (independently read by me): Normal sinus rhythm at 65 bpm.  No ectopy.  Normal intervals.  Mild RV conduction  delay.  February 2015 ECG (independently read by me): Normal sinus rhythm at 68 beats per minute. Normal intervals. No ectopy  LABS: BMP Latest Ref Rng & Units 07/06/2016 07/12/2010  Glucose 65 - 99 mg/dL 93 81  BUN 6 - 24 mg/dL 15 8  Creatinine 0.57 - 1.00 mg/dL 0.75 0.66  BUN/Creat Ratio 9 - 23 20 -  Sodium 134 - 144 mmol/L 140 137  Potassium 3.5 - 5.2 mmol/L 5.0 3.7  Chloride 96 - 106 mmol/L 102 110  CO2 18 - 29 mmol/L 23 21  Calcium 8.7 - 10.2 mg/dL 9.5 7.6(L)   Hepatic Function Latest Ref Rng & Units 07/06/2016  Total Protein 6.0 - 8.5 g/dL 6.8  Albumin 3.5 - 5.5 g/dL 4.5  AST 0 - 40 IU/L 20  ALT 0 - 32 IU/L 12  Alk Phosphatase 39 - 117 IU/L 45  Total Bilirubin 0.0 - 1.2 mg/dL 0.4   CBC Latest Ref Rng & Units 07/06/2016 07/12/2010  WBC 3.4 - 10.8 x10E3/uL 6.3 8.6  Hemoglobin 11.1 - 15.9 g/dL 13.5 12.6  Hematocrit 34.0 - 46.6 % 38.9 36.6  Platelets 150 - 379 x10E3/uL 278 318  Lab Results  Component Value Date   MCV 91 07/06/2016   MCV 92.5 07/12/2010   Lab Results  Component Value Date   TSH 2.590 07/06/2016   No results found for: HGBA1C  Lipid Panel     Component Value Date/Time   CHOL 211 (H) 07/06/2016 0803   TRIG 89 07/06/2016 0803   HDL 95 07/06/2016 0803   CHOLHDL 2.2 07/06/2016 0803   LDLCALC 98 07/06/2016 0803    RADIOLOGY: No results found.  IMPRESSION:  1. Palpitations   2. Non-rheumatic mitral regurgitation   3. Family history of ischemic heart disease   4. Gastroesophageal reflux disease without esophagitis     ASSESSMENT AND PLAN: Natalie Hooper is a very pleasant 46 year old female who has documented normal systolic and diastolic function with flat mitral valve closure and mild mitral regurgitation by an echo Doppler study in 2012. She was not found to have definitive mitral valve prolapse. She does have an intermittent click on exam and a systolic murmur suggestive of MR.  She has continued to do well and has had ectopy suppression on her  current regimen of metoprolol succinate 37.5 mg daily.  Reviewed laboratory from one year ago.  She continues to exercise regularly and remains asymptomatic.  She sees Dr. [place] Pink for primary care.  Laboratory will be rechecked by him.  She requests that I see her for yearly evaluation, particularly with her strong family history for ischemic heart disease.  I will see her one year for reevaluation.  Troy Sine, MD, [facility]  09/01/2017 2:04 PM

## 2017-08-30 NOTE — Patient Instructions (Signed)

## 2017-09-03 ENCOUNTER — Telehealth: Payer: Self-pay | Admitting: *Deleted

## 2017-09-03 NOTE — Telephone Encounter (Signed)
If no [place] time available, can schedule Pelvic US at a later visit.

## 2017-09-03 NOTE — Telephone Encounter (Signed)
Pt has annual exam scheduled on 09/06/17 states you typically do ultrasound yearly due to fibroids. Do you want same day as annual exam or schedule later date? Please advise

## 2017-09-04 ENCOUNTER — Other Ambulatory Visit: Payer: Self-pay | Admitting: Obstetrics & Gynecology

## 2017-09-04 DIAGNOSIS — D251 Intramural leiomyoma of uterus: Secondary | ICD-10-CM

## 2017-09-04 DIAGNOSIS — T8332XA Displacement of intrauterine contraceptive device, initial encounter: Secondary | ICD-10-CM

## 2017-09-04 NOTE — Telephone Encounter (Signed)
Relayed this to front desk and pam and blanca

## 2017-09-06 ENCOUNTER — Encounter: Payer: BLUE CROSS/BLUE SHIELD | Admitting: Obstetrics & Gynecology

## 2017-09-06 ENCOUNTER — Other Ambulatory Visit: Payer: BLUE CROSS/BLUE SHIELD

## 2017-09-06 ENCOUNTER — Telehealth: Payer: Self-pay | Admitting: *Deleted

## 2017-09-06 DIAGNOSIS — Z1321 Encounter for screening for nutritional disorder: Secondary | ICD-10-CM

## 2017-09-06 DIAGNOSIS — Z1329 Encounter for screening for other suspected endocrine disorder: Secondary | ICD-10-CM

## 2017-09-06 DIAGNOSIS — Z01419 Encounter for gynecological examination (general) (routine) without abnormal findings: Secondary | ICD-10-CM

## 2017-09-06 DIAGNOSIS — N951 Menopausal and female climacteric states: Secondary | ICD-10-CM

## 2017-09-06 DIAGNOSIS — Z13220 Encounter for screening for lipoid disorders: Secondary | ICD-10-CM

## 2017-09-06 NOTE — Telephone Encounter (Signed)
Patient has annual scheduled on 10/03/17 would like labs drawn prior to exam, also would like test to check hormone/estrgeone level. Please advise

## 2017-09-07 NOTE — Telephone Encounter (Signed)
Order placed , pt will schedule lab appointment.

## 2017-09-07 NOTE — Telephone Encounter (Signed)
Fasting labs:  CBC, CMP, FLP, TSH, Vit D.  Add an [facility] which is the best test to evaluate for perimenopause/menopause.

## 2017-09-10 NOTE — Telephone Encounter (Signed)
Pt lives in [place] and prefers to have labs drawn at a [facility] in [place], order faxed to [phone]

## 2017-10-03 ENCOUNTER — Other Ambulatory Visit: Payer: BLUE CROSS/BLUE SHIELD

## 2017-10-03 ENCOUNTER — Encounter: Payer: Self-pay | Admitting: Obstetrics & Gynecology

## 2017-10-03 ENCOUNTER — Encounter: Payer: BLUE CROSS/BLUE SHIELD | Admitting: Obstetrics & Gynecology

## 2017-10-03 ENCOUNTER — Ambulatory Visit: Payer: BLUE CROSS/BLUE SHIELD | Admitting: Obstetrics & Gynecology

## 2017-10-03 VITALS — BP 126/80 | Ht 62.0 in | Wt 116.0 lb

## 2017-10-03 DIAGNOSIS — Z975 Presence of (intrauterine) contraceptive device: Secondary | ICD-10-CM

## 2017-10-03 DIAGNOSIS — B373 Candidiasis of vulva and vagina: Secondary | ICD-10-CM | POA: Diagnosis not present

## 2017-10-03 DIAGNOSIS — B3731 Acute candidiasis of vulva and vagina: Secondary | ICD-10-CM

## 2017-10-03 DIAGNOSIS — Z01419 Encounter for gynecological examination (general) (routine) without abnormal findings: Secondary | ICD-10-CM

## 2017-10-03 DIAGNOSIS — T8332XD Displacement of intrauterine contraceptive device, subsequent encounter: Secondary | ICD-10-CM

## 2017-10-03 LAB — WET PREP FOR TRICH, YEAST, CLUE

## 2017-10-03 MED ORDER — ESTROGENS, CONJUGATED 0.625 MG/GM VA CREA: 1.0000 | TOPICAL_CREAM | VAGINAL | 4 refills | Status: DC

## 2017-10-03 MED ORDER — TERCONAZOLE 0.8 % VA CREA: 1.0000 | TOPICAL_CREAM | Freq: Every day | VAGINAL | 2 refills | Status: AC

## 2017-10-03 NOTE — Progress Notes (Signed)
Natalie Hooper Dec 16, 1970 [id number]   History:    46 y.o.G2P2L2  Married  RP:  Established patient presenting for annual gyn exam   HPI: Well on Mirena IUD times September 2014 .  Strings not visible previously, but IUD documented in intra-uterine cavity last year.  No abnormal bleeding.  No pelvic pain.  Mild increase in vaginal secretions.  No pain with intercourse.  Breasts normal.  Urine and bowel movements normal.  Health labs with family physician.  Past medical history,surgical history, family history and social history were all reviewed and documented in the EPIC chart.  Gynecologic History No LMP recorded. Patient is not currently having periods (Reason: IUD). Contraception: Mirena IUD Last Pap: 09/2016. Results were: negative Last mammogram: 2017. Results were: normal  Obstetric History OB History  Gravida Para Term Preterm AB Living  2       0 2  SAB TAB Ectopic Multiple Live Births      0        # Outcome Date GA Lbr Len/2nd Weight Sex Delivery Anes PTL Lv  2 Gravida           1 Gravida                ROS: A ROS was performed and pertinent positives and negatives are included in the history.  GENERAL: No fevers or chills. HEENT: No change in vision, no earache, sore throat or sinus congestion. NECK: No pain or stiffness. CARDIOVASCULAR: No chest pain or pressure. No palpitations. PULMONARY: No shortness of breath, cough or wheeze. GASTROINTESTINAL: No abdominal pain, nausea, vomiting or diarrhea, melena or bright red blood per rectum. GENITOURINARY: No urinary frequency, urgency, hesitancy or dysuria. MUSCULOSKELETAL: No joint or muscle pain, no back pain, no recent trauma. DERMATOLOGIC: No rash, no itching, no lesions. ENDOCRINE: No polyuria, polydipsia, no heat or cold intolerance. No recent change in weight. HEMATOLOGICAL: No anemia or easy bruising or bleeding. NEUROLOGIC: No headache, seizures, numbness, tingling or weakness. PSYCHIATRIC: No depression, no loss of  interest in normal activity or change in sleep pattern.     Exam:   BP 126/80   Ht 5\' 2"  (1.575 m)   Wt 116 lb (52.6 kg)   BMI 21.22 kg/m   Body mass index is 21.22 kg/m.  General appearance : Well developed well nourished female. No acute distress HEENT: Eyes: no retinal hemorrhage or exudates,  Neck supple, trachea midline, no carotid bruits, no thyroidmegaly Lungs: Clear to auscultation, no rhonchi or wheezes, or rib retractions  Heart: Regular rate and rhythm, no murmurs or gallops Breast:Examined in sitting and supine position were symmetrical in appearance, no palpable masses or tenderness,  no skin retraction, no nipple inversion, no nipple discharge, no skin discoloration, no axillary or supraclavicular lymphadenopathy Abdomen: no palpable masses or tenderness, no rebound or guarding Extremities: no edema or skin discoloration or tenderness  Pelvic: Vulva normal  Bartholin, Urethra, Skene Glands: Within normal limits             Vagina: No gross lesions.  Increased vaginal d/c, wet prep done.  Cervix: No gross lesions or discharge.  IUD strings not visible.  Uterus  AV, normal size, shape and consistency, non-tender and mobile  Adnexa  Without masses or tenderness  Anus and perineum  normal   Wet prep:  Yeasts present  Labs 09/19/2017:  CBC Hb 13.7, WBC 7.1, Plt 285  CMP: Glucose 90, Creat 0.88, AST 17, ALT 10                               FLP:  HDL 75, LDL 80, Triglycerides 123                               TSH 3.640                               FSH 3.1                               Vit D 36.7  Assessment/Plan:  46 y.o. female for annual exam   1. Well female exam with routine gynecological exam Normal gyn exam except for Yeast vaginitis and Atrophic vaginitis.  Pap neg 09/2016, will repeat next year.  Breasts wnl.  Will schedule screening Mammo at the [place].  Fasting Health Labs wnl as above.  Non menopausal.  2. IUD  contraception Mirena IUD well tolerated, but strings not seen.  F/U Pelvic US to locate IUD.  Insertion 07/2013.  3. Intrauterine contraceptive device threads lost, subsequent encounter F/U Pelvic US to locate IUD.  4. Yeast vaginitis Terazol prescription sent.  Probiotic 1 tablet intravaginally every week for prevention recommended. - WET PREP FOR [place], YEAST, CLUE  Counseling on above issues more than 50% for 10 minutes.  Princess Bruins MD, 12:29 PM 10/03/2017

## 2017-10-06 ENCOUNTER — Encounter: Payer: Self-pay | Admitting: Obstetrics & Gynecology

## 2017-10-06 NOTE — Patient Instructions (Signed)
1. Well female exam with routine gynecological exam Normal gyn exam except for Yeast vaginitis and Atrophic vaginitis.  Pap neg 09/2016, will repeat next year.  Breasts wnl.  Will schedule screening Mammo at the [facility].  Fasting Health Labs wnl as above.  Non menopausal.  2. IUD contraception Mirena IUD well tolerated, but strings not seen.  F/U Pelvic US to locate IUD.  Insertion 07/2013.  3. Intrauterine contraceptive device threads lost, subsequent encounter F/U Pelvic US to locate IUD.  4. Yeast vaginitis Terazol prescription sent.  Probiotic 1 tablet intravaginally every week for prevention recommended. - WET PREP FOR [place], YEAST, CLUE  Natalie Hooper, it was a pleasure seeing you today!  I will see you again soon for the pelvic ultrasound.  Health Maintenance, Female Adopting a healthy lifestyle and getting preventive care can go a long way to promote health and wellness. Talk with your health care provider about what schedule of regular examinations is right for you. This is a good chance for you to check in with your provider about disease prevention and staying healthy. In between checkups, there are plenty of things you can do on your own. Experts have done a lot of research about which lifestyle changes and preventive measures are most likely to keep you healthy. Ask your health care provider for more information. Weight and diet Eat a healthy diet  Be sure to include plenty of vegetables, fruits, low-fat dairy products, and lean protein.  Do not eat a lot of foods high in solid fats, added sugars, or salt.  Get regular exercise. This is one of the most important things you can do for your health. ? Most adults should exercise for at least 150 minutes each week. The exercise should increase your heart rate and make you sweat (moderate-intensity exercise). ? Most adults should also do strengthening exercises at least twice a week. This is in addition to the moderate-intensity  exercise.  Maintain a healthy weight  Body mass index (BMI) is a measurement that can be used to identify possible weight problems. It estimates body fat based on height and weight. Your health care provider can help determine your BMI and help you achieve or maintain a healthy weight.  For females 39 years of age and older: ? A BMI below 18.5 is considered underweight. ? A BMI of 18.5 to 24.9 is normal. ? A BMI of 25 to 29.9 is considered overweight. ? A BMI of 30 and above is considered obese.  Watch levels of cholesterol and blood lipids  You should start having your blood tested for lipids and cholesterol at 48 years of age, then have this test every 5 years.  You may need to have your cholesterol levels checked more often if: ? Your lipid or cholesterol levels are high. ? You are older than 48 years of age. ? You are at high risk for heart disease.  Cancer screening Lung Cancer  Lung cancer screening is recommended for adults 87-97 years old who are at high risk for lung cancer because of a history of smoking.  A yearly low-dose CT scan of the lungs is recommended for people who: ? Currently smoke. ? Have quit within the past 15 years. ? Have at least a 30-pack-year history of smoking. A pack year is smoking an average of one pack of cigarettes a day for 1 year.  Yearly screening should continue until it has been 15 years since you quit.  Yearly screening should stop if you  develop a health problem that would prevent you from having lung cancer treatment.  Breast Cancer  Practice breast self-awareness. This means understanding how your breasts normally appear and feel.  It also means doing regular breast self-exams. Let your health care provider know about any changes, no matter how small.  If you are in your 20s or 30s, you should have a clinical breast exam (CBE) by a health care provider every 1-3 years as part of a regular health exam.  If you are 56 or older, have  a CBE every year. Also consider having a breast X-ray (mammogram) every year.  If you have a family history of breast cancer, talk to your health care provider about genetic screening.  If you are at high risk for breast cancer, talk to your health care provider about having an MRI and a mammogram every year.  Breast cancer gene (BRCA) assessment is recommended for women who have family members with BRCA-related cancers. BRCA-related cancers include: ? Breast. ? Ovarian. ? Tubal. ? Peritoneal cancers.  Results of the assessment will determine the need for genetic counseling and BRCA1 and BRCA2 testing.  Cervical Cancer Your health care provider may recommend that you be screened regularly for cancer of the pelvic organs (ovaries, uterus, and vagina). This screening involves a pelvic examination, including checking for microscopic changes to the surface of your cervix (Pap test). You may be encouraged to have this screening done every 3 years, beginning at age 81.  For women ages 22-65, health care providers may recommend pelvic exams and Pap testing every 3 years, or they may recommend the Pap and pelvic exam, combined with testing for human papilloma virus (HPV), every 5 years. Some types of HPV increase your risk of cervical cancer. Testing for HPV may also be done on women of any age with unclear Pap test results.  Other health care providers may not recommend any screening for nonpregnant women who are considered low risk for pelvic cancer and who do not have symptoms. Ask your health care provider if a screening pelvic exam is right for you.  If you have had past treatment for cervical cancer or a condition that could lead to cancer, you need Pap tests and screening for cancer for at least 20 years after your treatment. If Pap tests have been discontinued, your risk factors (such as having a new sexual partner) need to be reassessed to determine if screening should resume. Some women have  medical problems that increase the chance of getting cervical cancer. In these cases, your health care provider may recommend more frequent screening and Pap tests.  Colorectal Cancer  This type of cancer can be detected and often prevented.  Routine colorectal cancer screening usually begins at 48 years of age and continues through 48 years of age.  Your health care provider may recommend screening at an earlier age if you have risk factors for colon cancer.  Your health care provider may also recommend using home test kits to check for hidden blood in the stool.  A small camera at the end of a tube can be used to examine your colon directly (sigmoidoscopy or colonoscopy). This is done to check for the earliest forms of colorectal cancer.  Routine screening usually begins at age 60.  Direct examination of the colon should be repeated every 5-10 years through 48 years of age. However, you may need to be screened more often if early forms of precancerous polyps or small growths are found.  Skin Cancer  Check your skin from head to toe regularly.  Tell your health care provider about any new moles or changes in moles, especially if there is a change in a mole's shape or color.  Also tell your health care provider if you have a mole that is larger than the size of a pencil eraser.  Always use sunscreen. Apply sunscreen liberally and repeatedly throughout the day.  Protect yourself by wearing long sleeves, pants, a wide-brimmed hat, and sunglasses whenever you are outside.  Heart disease, diabetes, and high blood pressure  High blood pressure causes heart disease and increases the risk of stroke. High blood pressure is more likely to develop in: ? People who have blood pressure in the high end of the normal range (130-139/85-89 mm Hg). ? People who are overweight or obese. ? People who are African American.  If you are 85-72 years of age, have your blood pressure checked every 3-5  years. If you are 58 years of age or older, have your blood pressure checked every year. You should have your blood pressure measured twice-once when you are at a hospital or clinic, and once when you are not at a hospital or clinic. Record the average of the two measurements. To check your blood pressure when you are not at a hospital or clinic, you can use: ? An automated blood pressure machine at a pharmacy. ? A home blood pressure monitor.  If you are between 19 years and 54 years old, ask your health care provider if you should take aspirin to prevent strokes.  Have regular diabetes screenings. This involves taking a blood sample to check your fasting blood sugar level. ? If you are at a normal weight and have a low risk for diabetes, have this test once every three years after 48 years of age. ? If you are overweight and have a high risk for diabetes, consider being tested at a younger age or more often. Preventing infection Hepatitis B  If you have a higher risk for hepatitis B, you should be screened for this virus. You are considered at high risk for hepatitis B if: ? You were born in a country where hepatitis B is common. Ask your health care provider which countries are considered high risk. ? Your parents were born in a high-risk country, and you have not been immunized against hepatitis B (hepatitis B vaccine). ? You have HIV or AIDS. ? You use needles to inject street drugs. ? You live with someone who has hepatitis B. ? You have had sex with someone who has hepatitis B. ? You get hemodialysis treatment. ? You take certain medicines for conditions, including cancer, organ transplantation, and autoimmune conditions.  Hepatitis C  Blood testing is recommended for: ? Everyone born from [birth year] through 1967. ? Anyone with known risk factors for hepatitis C.  Sexually transmitted infections (STIs)  You should be screened for sexually transmitted infections (STIs) including  gonorrhea and chlamydia if: ? You are sexually active and are younger than 48 years of age. ? You are older than 48 years of age and your health care provider tells you that you are at risk for this type of infection. ? Your sexual activity has changed since you were last screened and you are at an increased risk for chlamydia or gonorrhea. Ask your health care provider if you are at risk.  If you do not have HIV, but are at risk, it may be recommended that you  take a prescription medicine daily to prevent HIV infection. This is called pre-exposure prophylaxis (PrEP). You are considered at risk if: ? You are sexually active and do not regularly use condoms or know the HIV status of your partner(s). ? You take drugs by injection. ? You are sexually active with a partner who has HIV.  Talk with your health care provider about whether you are at high risk of being infected with HIV. If you choose to begin PrEP, you should first be tested for HIV. You should then be tested every 3 months for as long as you are taking PrEP. Pregnancy  If you are premenopausal and you may become pregnant, ask your health care provider about preconception counseling.  If you may become pregnant, take 400 to 800 micrograms (mcg) of folic acid every day.  If you want to prevent pregnancy, talk to your health care provider about birth control (contraception). Osteoporosis and menopause  Osteoporosis is a disease in which the bones lose minerals and strength with aging. This can result in serious bone fractures. Your risk for osteoporosis can be identified using a bone density scan.  If you are 53 years of age or older, or if you are at risk for osteoporosis and fractures, ask your health care provider if you should be screened.  Ask your health care provider whether you should take a calcium or vitamin D supplement to lower your risk for osteoporosis.  Menopause may have certain physical symptoms and  risks.  Hormone replacement therapy may reduce some of these symptoms and risks. Talk to your health care provider about whether hormone replacement therapy is right for you. Follow these instructions at home:  Schedule regular health, dental, and eye exams.  Stay current with your immunizations.  Do not use any tobacco products including cigarettes, chewing tobacco, or electronic cigarettes.  If you are pregnant, do not drink alcohol.  If you are breastfeeding, limit how much and how often you drink alcohol.  Limit alcohol intake to no more than 1 drink per day for nonpregnant women. One drink equals 12 ounces of beer, 5 ounces of wine, or 1 ounces of hard liquor.  Do not use street drugs.  Do not share needles.  Ask your health care provider for help if you need support or information about quitting drugs.  Tell your health care provider if you often feel depressed.  Tell your health care provider if you have ever been abused or do not feel safe at home. This information is not intended to replace advice given to you by your health care provider. Make sure you discuss any questions you have with your health care provider. Document Released: 05/08/2011 Document Revised: 03/30/2016 Document Reviewed: 07/27/2015 Elsevier Interactive Patient Education  [employer].

## 2017-10-09 ENCOUNTER — Ambulatory Visit: Payer: BLUE CROSS/BLUE SHIELD | Admitting: Obstetrics & Gynecology

## 2017-10-09 ENCOUNTER — Encounter: Payer: Self-pay | Admitting: Obstetrics & Gynecology

## 2017-10-09 VITALS — BP 122/82

## 2017-10-09 DIAGNOSIS — N898 Other specified noninflammatory disorders of vagina: Secondary | ICD-10-CM | POA: Diagnosis not present

## 2017-10-09 DIAGNOSIS — E349 Endocrine disorder, unspecified: Secondary | ICD-10-CM | POA: Diagnosis not present

## 2017-10-09 DIAGNOSIS — T8332XD Displacement of intrauterine contraceptive device, subsequent encounter: Secondary | ICD-10-CM

## 2017-10-09 LAB — WET PREP FOR TRICH, YEAST, CLUE

## 2017-10-09 NOTE — Progress Notes (Signed)
    Natalie Hooper Oct 02, 1971 [id number]        46 y.o.  G2P0002   RP:  Counseling on menopause and yeast vaginitis  HPI:  Annual/Gyn visit on 10/03/2017:  Well on Mirena IUD times September 2014 .  Strings not visible previously, but IUD documented in intra-uterine cavity last year and will f/u for a Pelvic US soon.  No abnormal bleeding currently.  No pelvic pain.  Had mild increase in vaginal secretions and on wet prep, a yeast vaginitis was diagnosed.  Treated with Terazol, then Probiotic vaginally.  No pain with intercourse.  Was prescribed Premarin cream for Atrophic Vaginitis, but not using it because not menopausal with FSH 3.1 on 09/19/2017 and was not helping anyways.    Past medical history,surgical history, problem list, medications, allergies, family history and social history were all reviewed and documented in the EPIC chart.  Directed ROS with pertinent positives and negatives documented in the history of present illness/assessment and plan.  Exam:  Vitals:   10/09/17 1106  BP: 122/82   General appearance:  Normal  Gyn exam:  Vulva normal.  Speculum:  Vagina, cervix normal.  Secretions wnl.  Wet prep done.  Wet prep: Negative   FSH 3.1 Non-menopausal on 09/19/2017   Assessment/Plan:  46 y.o. G2P0002   1. Vaginal discharge Normal wet prep.  Reassured.  Will continue probiotic tablet vaginally once a week as needed. - WET PREP FOR [place], YEAST, CLUE  2. Intrauterine contraceptive device threads lost, subsequent encounter F/U Pelvic US to locate IUD.  3. 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  Thought she might be menopausal based on vaginal symptoms which were probably associated with yeast vaginitis and Mirena IUD.  Doing better now.  Will just use Astroglide as needed with intercourse.  Reassured.  Counseling on above more than 50% for 15 minutes.  Princess Bruins MD, 11:14 AM 10/09/2017

## 2017-10-15 ENCOUNTER — Encounter: Payer: Self-pay | Admitting: Obstetrics & Gynecology

## 2017-10-15 NOTE — Patient Instructions (Addendum)
1. Vaginal discharge Normal wet prep.  Reassured.  Will continue probiotic tablet vaginally once a week as needed. - WET PREP FOR [place], YEAST, CLUE  2. Intrauterine contraceptive device threads lost, subsequent encounter F/U Pelvic US to locate IUD.  3. 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  Thought she might be menopausal based on vaginal symptoms which were probably associated with yeast vaginitis and Mirena IUD.  Doing better now.  Will just use Astroglide as needed with intercourse.  Reassured.   Hinton Dyer, good seeing you today!

## 2017-10-16 ENCOUNTER — Other Ambulatory Visit: Payer: Self-pay | Admitting: Obstetrics & Gynecology

## 2017-10-16 DIAGNOSIS — Z1231 Encounter for screening mammogram for malignant neoplasm of breast: Secondary | ICD-10-CM

## 2017-10-24 ENCOUNTER — Other Ambulatory Visit: Payer: Self-pay | Admitting: Obstetrics & Gynecology

## 2017-10-24 ENCOUNTER — Ambulatory Visit (INDEPENDENT_AMBULATORY_CARE_PROVIDER_SITE_OTHER): Payer: BLUE CROSS/BLUE SHIELD

## 2017-10-24 ENCOUNTER — Ambulatory Visit (INDEPENDENT_AMBULATORY_CARE_PROVIDER_SITE_OTHER): Payer: BLUE CROSS/BLUE SHIELD | Admitting: Obstetrics & Gynecology

## 2017-10-24 ENCOUNTER — Encounter: Payer: Self-pay | Admitting: Obstetrics & Gynecology

## 2017-10-24 VITALS — BP 128/72

## 2017-10-24 DIAGNOSIS — Z30431 Encounter for routine checking of intrauterine contraceptive device: Secondary | ICD-10-CM | POA: Diagnosis not present

## 2017-10-24 DIAGNOSIS — T8332XD Displacement of intrauterine contraceptive device, subsequent encounter: Secondary | ICD-10-CM

## 2017-10-24 DIAGNOSIS — D251 Intramural leiomyoma of uterus: Secondary | ICD-10-CM

## 2017-10-24 DIAGNOSIS — B373 Candidiasis of vulva and vagina: Secondary | ICD-10-CM | POA: Diagnosis not present

## 2017-10-24 DIAGNOSIS — B3731 Acute candidiasis of vulva and vagina: Secondary | ICD-10-CM

## 2017-10-24 MED ORDER — FLUCONAZOLE 150 MG PO TABS: 150.0000 mg | ORAL_TABLET | Freq: Every day | ORAL | 3 refills | Status: AC

## 2017-10-24 NOTE — Progress Notes (Signed)
    Natalie Hooper 1971-08-27 [id number]        46 y.o.  G2P0002   RP:  IUD strings lost  HPI: Well on Mirena IUD.  Strings not seen at the time of the annual exam on October 03, 2017.  Patient had a yeast vaginitis again since her presentation October 09, 2017.  Treated with Terazol x2 in the past months.  No vaginal discharge, itching or odor currently.  No pelvic pain.  Past medical history,surgical history, problem list, medications, allergies, family history and social history were all reviewed and documented in the EPIC chart.  Directed ROS with pertinent positives and negatives documented in the history of present illness/assessment and plan.  Exam:  Vitals:   10/24/17 1440  BP: 128/72   General appearance:  Normal  Pelvic US today: T/V anteverted uterus measuring 7.34 x 6.49 x 4.94 cm.  Endometrial lining is thin and normal at 3.3 mm.  Intramural fibroids largest measuring 2.8 x 2.6 cm then 2.4 x 2.2 cm, 2.3 x 2.0 cm and 1.2 x 1.1 cm.  IUD in good intrauterine position.  Left ovary normal.  Right ovary with a thick-walled corpus luteum cyst measuring 1.7 x 1.1 cm and color flow Doppler present at the periphery.  No free fluid in the posterior cul-de-sac.   Assessment/Plan:  46 y.o. G2P0002   1. Intrauterine contraceptive device threads lost, subsequent encounter Mirena IUD in good IU location.  Patient reassured.  Ultrasound findings as above discussed.  Stable uterine myomas.  Right functional ovarian cyst.  2. Vaginal yeast infection Frequent vaginal yeast infections.  Prescription of fluconazole sent to pharmacy, to use as needed.  As symptomatic currently.  Recommend probiotic tablet vaginally every week as needed.  Other orders - fluconazole (DIFLUCAN) 150 MG tablet; Take 1 tablet (150 mg total) by mouth daily for 3 days.  Counseling on above issues more than 50% for 15 minutes.  Princess Bruins MD, 2:48 PM 10/24/2017

## 2017-10-28 ENCOUNTER — Encounter: Payer: Self-pay | Admitting: Obstetrics & Gynecology

## 2017-10-28 NOTE — Patient Instructions (Signed)
1. Intrauterine contraceptive device threads lost, subsequent encounter Mirena IUD in good IU location.  Patient reassured.  Ultrasound findings as above discussed.  Stable uterine myomas.  Right functional ovarian cyst.  2. Vaginal yeast infection Frequent vaginal yeast infections.  Prescription of fluconazole sent to pharmacy, to use as needed.  As symptomatic currently.  Recommend probiotic tablet vaginally every week as needed.  Other orders - fluconazole (DIFLUCAN) 150 MG tablet; Take 1 tablet (150 mg total) by mouth daily for 3 days.  Hinton Dyer, good seeing you today!

## 2017-11-14 ENCOUNTER — Ambulatory Visit
Admission: RE | Admit: 2017-11-14 | Discharge: 2017-11-14 | Disposition: A | Discharge status: Home / Self Care | Payer: BLUE CROSS/BLUE SHIELD | Source: Ambulatory Visit | Attending: Obstetrics & Gynecology | Admitting: Obstetrics & Gynecology

## 2017-11-14 DIAGNOSIS — Z1231 Encounter for screening mammogram for malignant neoplasm of breast: Secondary | ICD-10-CM

## 2018-03-25 ENCOUNTER — Other Ambulatory Visit: Payer: Self-pay | Admitting: Cardiovascular Disease

## 2018-03-25 NOTE — Telephone Encounter (Signed)
Rx has been sent to the pharmacy electronically. ° °

## 2018-06-03 ENCOUNTER — Telehealth: Payer: Self-pay | Admitting: *Deleted

## 2018-06-03 DIAGNOSIS — Z30432 Encounter for removal of intrauterine contraceptive device: Secondary | ICD-10-CM

## 2018-06-03 DIAGNOSIS — Z30430 Encounter for insertion of intrauterine contraceptive device: Secondary | ICD-10-CM

## 2018-06-03 NOTE — Telephone Encounter (Signed)
Patient scheduled her removal and insert new mirena for 07/25/18 says you always order u/s guidance. Okay to schedule?

## 2018-06-05 ENCOUNTER — Other Ambulatory Visit: Payer: Self-pay | Admitting: Obstetrics & Gynecology

## 2018-06-05 DIAGNOSIS — Z30433 Encounter for removal and reinsertion of intrauterine contraceptive device: Secondary | ICD-10-CM

## 2018-06-05 NOTE — Telephone Encounter (Signed)
Yes, please schedule with US guidance.

## 2018-06-05 NOTE — Telephone Encounter (Signed)
Order placed. Butch Penny will be notified to call her to schedule.

## 2018-07-25 ENCOUNTER — Ambulatory Visit (INDEPENDENT_AMBULATORY_CARE_PROVIDER_SITE_OTHER): Payer: BLUE CROSS/BLUE SHIELD | Admitting: Obstetrics & Gynecology

## 2018-07-25 ENCOUNTER — Other Ambulatory Visit: Payer: Self-pay | Admitting: Obstetrics & Gynecology

## 2018-07-25 ENCOUNTER — Ambulatory Visit (INDEPENDENT_AMBULATORY_CARE_PROVIDER_SITE_OTHER): Payer: BLUE CROSS/BLUE SHIELD

## 2018-07-25 DIAGNOSIS — Z30430 Encounter for insertion of intrauterine contraceptive device: Secondary | ICD-10-CM

## 2018-07-25 DIAGNOSIS — Z30433 Encounter for removal and reinsertion of intrauterine contraceptive device: Secondary | ICD-10-CM | POA: Diagnosis not present

## 2018-07-25 DIAGNOSIS — T8332XD Displacement of intrauterine contraceptive device, subsequent encounter: Secondary | ICD-10-CM

## 2018-07-25 DIAGNOSIS — Z30432 Encounter for removal of intrauterine contraceptive device: Secondary | ICD-10-CM | POA: Diagnosis not present

## 2018-07-25 NOTE — Progress Notes (Signed)
    Natalie Hooper May 01, 1971 [id number]        47 y.o.  G2P0002   RP: Mirena IUD removal and insertion under ultrasound guidance.  HPI: Mirena IUD due to change.  Lost IUD strings.   OB History  Gravida Para Term Preterm AB Living  2       0 2  SAB TAB Ectopic Multiple Live Births      0        # Outcome Date GA Lbr Len/2nd Weight Sex Delivery Anes PTL Lv  2 Gravida           1 Gravida             Past medical history,surgical history, problem list, medications, allergies, family history and social history were all reviewed and documented in the EPIC chart.   Directed ROS with pertinent positives and negatives documented in the history of present illness/assessment and plan.  Exam:  There were no vitals filed for this visit. General appearance:  Normal                                                                    IUD procedure note       Patient presented to the office today for removal and placement of [place] IUDs. The patient had previously been provided with literature information on this method of contraception. The risks benefits and pros and cons were discussed and all her questions were answered. She is fully aware that this form of contraception is 99% effective and is good for 5 years.  Pelvic US: Uterus: AV position, normal.  Ovaries normal bilaterally.  The cervix was cleansed with Betadine solution. Hurricane spray on the cervix.  A single-tooth tenaculum was placed on the anterior cervical lip.  Using the IUD removal clamp, under ultrasound guidance, the IUD was removed without complication.  Patient had a severe cramp, but otherwise tolerated the procedure well.   We then proceeded with the Mirena IUD insertion.  The IUD was shown to the patient and inserted in a sterile fashion under ultrasound guidance.  Hysterometry with the IUD as being inserted was 7 cm.  The IUD string was trimmed, a little longer than usual. The single-tooth tenaculum was removed.  Patient was instructed to return back to the office in one month for follow up.       Assessment/Plan:  47 y.o. G2P0002   1. Intrauterine contraceptive device threads lost, subsequent encounter Successful removal of the Mirena IUD under ultrasound guidance using the IUD removal clamp.  2. Encounter for removal and reinsertion of intrauterine contraceptive device (IUD) After removal of the Mirena IUD, a new IUD was inserted under ultrasound guidance successfully.  Patient will follow-up in 4 weeks to verify the IUD placement and confirm no sign of infection.  Princess Bruins MD, 3:36 PM 07/25/2018

## 2018-07-28 ENCOUNTER — Encounter: Payer: Self-pay | Admitting: Obstetrics & Gynecology

## 2018-07-28 NOTE — Patient Instructions (Signed)
1. Intrauterine contraceptive device threads lost, subsequent encounter Successful removal of the Mirena IUD under ultrasound guidance using the IUD removal clamp.  2. Encounter for removal and reinsertion of intrauterine contraceptive device (IUD) After removal of the Mirena IUD, a new IUD was inserted under ultrasound guidance successfully.  Patient will follow-up in 4 weeks to verify the IUD placement and confirm no sign of infection.  Hinton Dyer, good seeing you today!

## 2018-08-07 ENCOUNTER — Encounter: Payer: Self-pay | Admitting: Anesthesiology

## 2018-08-22 ENCOUNTER — Ambulatory Visit: Payer: BLUE CROSS/BLUE SHIELD | Admitting: Obstetrics & Gynecology

## 2018-08-27 ENCOUNTER — Encounter: Payer: Self-pay | Admitting: Obstetrics & Gynecology

## 2018-08-27 ENCOUNTER — Ambulatory Visit: Payer: BLUE CROSS/BLUE SHIELD | Admitting: Obstetrics & Gynecology

## 2018-08-27 VITALS — BP 128/70

## 2018-08-27 DIAGNOSIS — Z30431 Encounter for routine checking of intrauterine contraceptive device: Secondary | ICD-10-CM

## 2018-08-27 NOTE — Progress Notes (Signed)
    Natalie Hooper [birth year] [id number]        47 y.o.  G2P2L2 Married  RP: IUD check after insertion 4 weeks ago  HPI: No pelvic pain.  No vaginal bleeding.  Normal vaginal secretions.  No pain with IC.  No fever.   OB History  Gravida Para Term Preterm AB Living  2       0 2  SAB TAB Ectopic Multiple Live Births      0        # Outcome Date GA Lbr Len/2nd Weight Sex Delivery Anes PTL Lv  2 Gravida           1 Gravida             Past medical history,surgical history, problem list, medications, allergies, family history and social history were all reviewed and documented in the EPIC chart.   Directed ROS with pertinent positives and negatives documented in the history of present illness/assessment and plan.  Exam:  Vitals:   08/27/18 1158  BP: 128/70   General appearance:  Normal  Gynecologic exam: Vulva normal.  Speculum:  Cervix normal, no erythema.  IUD strings visible.   Assessment/Plan:  47 y.o. G2P0002   1. IUD check up Mirena IUD well-tolerated.  Confirm good position with strings visible.  No sign of infection.  Patient reassured.  Will follow-up for annual gynecologic exam.  Counseling on above issues and coordination of care >50% x 15 minutes.  Princess Bruins MD, 12:09 PM 08/27/2018

## 2018-08-27 NOTE — Patient Instructions (Signed)
1. IUD check up Mirena IUD well-tolerated.  Confirm good position with strings visible.  No sign of infection.  Patient reassured.  Will follow-up for annual gynecologic exam.  Natalie Hooper, it was a pleasure seeing you today!

## 2018-10-08 ENCOUNTER — Ambulatory Visit: Payer: BLUE CROSS/BLUE SHIELD | Admitting: Obstetrics & Gynecology

## 2018-10-08 ENCOUNTER — Encounter: Payer: Self-pay | Admitting: Obstetrics & Gynecology

## 2018-10-08 VITALS — BP 140/86 | Ht 61.75 in | Wt 117.0 lb

## 2018-10-08 DIAGNOSIS — Z01419 Encounter for gynecological examination (general) (routine) without abnormal findings: Secondary | ICD-10-CM

## 2018-10-08 DIAGNOSIS — Z30431 Encounter for routine checking of intrauterine contraceptive device: Secondary | ICD-10-CM | POA: Diagnosis not present

## 2018-10-08 NOTE — Patient Instructions (Signed)
1. Encounter for routine gynecological examination with Papanicolaou smear of cervix Normal gynecologic exam.  Pap reflex done.  Breast exam normal.  Will schedule next screening mammogram in January 2020.  Recent health labs with family physician September 25, 2018 were all normal.  Good body mass index at 21.57.  Continue with fitness and healthy nutrition.  2. Encounter for routine checking of intrauterine contraceptive device (IUD) Mirena IUD in good position, well-tolerated.  Natalie Hooper, it was a pleasure seeing you today!  I will inform you of your results as soon as they are available.

## 2018-10-08 NOTE — Progress Notes (Signed)
Natalie Hooper 1971-10-12 [id number]   History:    47 y.o. G2P2L2 Married  RP:  Established patient presenting for annual gyn exam   HPI: Well on Mirena IUD since July 25, 2018.  No breakthrough bleeding.  No pelvic pain.  No pain with intercourse.  Urine and bowel movements normal.  Breasts normal.  Body mass index 21.57.  Physically active.  Health labs all normal with family physician at [facility] done September 25, 2018.  Past medical history,surgical history, family history and social history were all reviewed and documented in the EPIC chart.  Gynecologic History No LMP recorded. (Menstrual status: IUD). Contraception: Mirena IUD x 09/2018 Last Pap: 2018. Results were: normal Last mammogram: 11/2017. Results were: Negative Bone Density: Never Colonoscopy: Never  Obstetric History OB History  Gravida Para Term Preterm AB Living  2       0 2  SAB TAB Ectopic Multiple Live Births      0        # Outcome Date GA Lbr Len/2nd Weight Sex Delivery Anes PTL Lv  2 Gravida           1 Gravida              ROS: A ROS was performed and pertinent positives and negatives are included in the history.  GENERAL: No fevers or chills. HEENT: No change in vision, no earache, sore throat or sinus congestion. NECK: No pain or stiffness. CARDIOVASCULAR: No chest pain or pressure. No palpitations. PULMONARY: No shortness of breath, cough or wheeze. GASTROINTESTINAL: No abdominal pain, nausea, vomiting or diarrhea, melena or bright red blood per rectum. GENITOURINARY: No urinary frequency, urgency, hesitancy or dysuria. MUSCULOSKELETAL: No joint or muscle pain, no back pain, no recent trauma. DERMATOLOGIC: No rash, no itching, no lesions. ENDOCRINE: No polyuria, polydipsia, no heat or cold intolerance. No recent change in weight. HEMATOLOGICAL: No anemia or easy bruising or bleeding. NEUROLOGIC: No headache, seizures, numbness, tingling or weakness. PSYCHIATRIC: No depression, no loss of interest in  normal activity or change in sleep pattern.     Exam:   BP 140/86   Ht 5' 1.75" (1.568 m)   Wt 117 lb (53.1 kg)   BMI 21.57 kg/m   Body mass index is 21.57 kg/m.  General appearance : Well developed well nourished female. No acute distress HEENT: Eyes: no retinal hemorrhage or exudates,  Neck supple, trachea midline, no carotid bruits, no thyroidmegaly Lungs: Clear to auscultation, no rhonchi or wheezes, or rib retractions  Heart: Regular rate and rhythm, no murmurs or gallops Breast:Examined in sitting and supine position were symmetrical in appearance, no palpable masses or tenderness,  no skin retraction, no nipple inversion, no nipple discharge, no skin discoloration, no axillary or supraclavicular lymphadenopathy Abdomen: no palpable masses or tenderness, no rebound or guarding Extremities: no edema or skin discoloration or tenderness  Pelvic: Vulva: Normal             Vagina: No gross lesions or discharge  Cervix: No gross lesions or discharge.  Strings visible.  Pap reflex done.  Uterus  AV, normal size, shape and consistency, non-tender and mobile  Adnexa  Without masses or tenderness  Anus: Normal   Assessment/Plan:  47 y.o. female for annual exam   1. Encounter for routine gynecological examination with Papanicolaou smear of cervix Normal gynecologic exam.  Pap reflex done.  Breast exam normal.  Will schedule next screening mammogram in January 2020.  Recent health labs with family  physician September 25, 2018 were all normal.  Good body mass index at 21.57.  Continue with fitness and healthy nutrition.  2. Encounter for routine checking of intrauterine contraceptive device (IUD) Mirena IUD in good position, well-tolerated.  Princess Bruins MD, 2:03 PM 10/08/2018

## 2018-10-08 NOTE — Addendum Note (Signed)
Addended by: Thurnell Garbe A on: 10/08/2018 03:24 PM   Modules accepted: Orders

## 2018-10-09 LAB — PAP IG W/ RFLX HPV ASCU

## 2018-10-17 ENCOUNTER — Ambulatory Visit: Payer: BLUE CROSS/BLUE SHIELD | Admitting: Cardiovascular Disease

## 2018-10-17 ENCOUNTER — Encounter: Payer: Self-pay | Admitting: Cardiovascular Disease

## 2018-10-17 VITALS — BP 128/80 | HR 63 | Ht 62.0 in | Wt 118.0 lb

## 2018-10-17 DIAGNOSIS — E785 Hyperlipidemia, unspecified: Secondary | ICD-10-CM | POA: Diagnosis not present

## 2018-10-17 DIAGNOSIS — R002 Palpitations: Secondary | ICD-10-CM

## 2018-10-17 DIAGNOSIS — K219 Gastro-esophageal reflux disease without esophagitis: Secondary | ICD-10-CM

## 2018-10-17 DIAGNOSIS — Z8249 Family history of ischemic heart disease and other diseases of the circulatory system: Secondary | ICD-10-CM | POA: Diagnosis not present

## 2018-10-17 DIAGNOSIS — R011 Cardiac murmur, unspecified: Secondary | ICD-10-CM | POA: Diagnosis not present

## 2018-10-17 NOTE — Progress Notes (Signed)
Patient ID: Natalie Hooper, female   DOB: 08-Jul-1971, 47 y.o.   MRN: [id number]      Primary M.D.: [name]  HPI: Natalie Hooper is a 47 y.o. female who presents for a 14 month follow-up cardiology evaluation. She is the daughter of my patient Ms. Natalie Hooper.  Natalie Hooper has a history of palpitations.  An echo Doppler study November 2012 showed normal systolic and diastolic function. She had flat mitral valve coaptation without definitive prolapse and mild mitral regurgitation with trivial tricuspid regurgitation. She had normal pulmonary pressures.  She has been on metoprolol succinate at 37.5 mg daily which has essentially controlled her palpitations. There is a very rare instance where she does note an occasional palpitation and she has taken an extra half of this metoprolol.  This usually occurs when she is resting under periods of increased stress.    I last saw her in October 2018.  At that time, she was doing well.  Palpitations were controlled with metoprolol.  She was sizing daily and remained active.    SinceI last saw her, continues to be active.  She often walks several miles and exercises daily.  She denies any episodes of chest tightness or pressure.  She had undergone laboratory by her primary physician, Dr. Kary Kos and total cholesterol was 210, triglycerides 77, HDL 72, and LDL 122.  She is not on any therapy for lipids.  She has a strong family history for CAD in her mother who had significant multivessel CAD and required CABG revascularization.  She has rare episodes of GERD for which she takes omeprazole as needed.  She presents for your evaluation.  Past Medical History:  Diagnosis Date  . Arrhythmia    Hx of Palp.    Past Surgical History:  Procedure Laterality Date  . DILATION AND CURETTAGE OF UTERUS    . DOBUTAMINE STRESS ECHO  05/22/2011   Normal treadmill,normal stress echo  . DOPPLER ECHOCARDIOGRAPHY  10/03/2011   EF = >55%,mild mitral regurg  . Event  Monitor  06/24/2008   NSR,Sinus Tach  . MYOMECTOMY  2011  . TONSILLECTOMY     age 7    No Known Allergies  Current Outpatient Medications  Medication Sig Dispense Refill  . acetaminophen (TYLENOL) 325 MG tablet Take 650 mg by mouth as needed.    Marland Kitchen ibuprofen (ADVIL,MOTRIN) 200 MG tablet Take 200 mg by mouth as needed.    Marland Kitchen levonorgestrel (MIRENA) 20 MCG/24HR IUD 1 each by Intrauterine route once.    . metoprolol succinate (TOPROL-XL) 25 MG 24 hr tablet TAKE 1.5 TABLETS (37.5 MG TOTAL) BY MOUTH DAILY. 135 tablet 2  . omeprazole (PRILOSEC) 20 MG capsule Take 1 capsule by mouth as needed.     No current facility-administered medications for this visit.     Social History   Socioeconomic History  . Marital status: Married    Spouse name: Not on file  . Number of children: Not on file  . Years of education: Not on file  . Highest education level: Not on file  Occupational History  . Not on file  Social Needs  . Financial resource strain: Not on file  . Food insecurity:    Worry: Not on file    Inability: Not on file  . Transportation needs:    Medical: Not on file    Non-medical: Not on file  Tobacco Use  . Smoking status: Former Smoker    Years: 5.00    Types:  Cigarettes    Last attempt to quit: 12/10/1996    Years since quitting: 21.8  . Smokeless tobacco: Never Used  Substance and Sexual Activity  . Alcohol use: Yes    Comment: occas  . Drug use: No  . Sexual activity: Yes    Partners: Male    Comment: intercourse age 46, less than 5 sexual partners,des neg  Lifestyle  . Physical activity:    Days per week: Not on file    Minutes per session: Not on file  . Stress: Not on file  Relationships  . Social connections:    Talks on phone: Not on file    Gets together: Not on file    Attends religious service: Not on file    Active member of club or organization: Not on file    Attends meetings of clubs or organizations: Not on file    Relationship status: Not on  file  . Intimate partner violence:    Fear of current or ex partner: Not on file    Emotionally abused: Not on file    Physically abused: Not on file    Forced sexual activity: Not on file  Other Topics Concern  . Not on file  Social History Narrative  . Not on file   Socially she is married and has 2 children, ages 53 and 63. There is no tobacco use. She does drink occasional alcohol.  Family History  Problem Relation Age of Onset  . Heart disease Mother 18  . Colon cancer Mother   . Hypertension Mother   . Hypertension Maternal Grandmother   . Heart disease Maternal Grandmother   . Breast cancer Maternal Grandmother   . Hypertension Maternal Grandfather   . Diabetes Maternal Grandfather   . Heart disease Maternal Grandfather 61  . Diabetes Maternal Aunt   . Colon cancer Maternal Uncle   . Heart disease Maternal Uncle    ROS General: Negative; No fevers, chills, or night sweats;  HEENT: Positive for rare nosebleeds; No changes in vision or hearing, sinus congestion, difficulty swallowing Pulmonary: Negative; No cough, wheezing, shortness of breath, hemoptysis Cardiovascular: Negative; No chest pain, presyncope, syncope, palpitations GI: Positive for mild GERD; No nausea, vomiting, diarrhea, or abdominal pain GU: Negative; No dysuria, hematuria, or difficulty voiding Musculoskeletal: Negative; no myalgias, joint pain, or weakness Hematologic/Oncology: Negative; no easy bruising, bleeding Endocrine: Negative; no heat/cold intolerance; no diabetes Neuro: Negative; no changes in balance, headaches Skin: Negative; No rashes or skin lesions Psychiatric: Negative; No behavioral problems, depression Sleep: Negative; No snoring, daytime sleepiness, hypersomnolence, bruxism, restless legs, hypnogognic hallucinations, no cataplexy Other comprehensive 14 point system review is negative.   PE BP 128/80   Pulse 63   Ht 5' 2" (1.575 m)   Wt 118 lb (53.5 kg)   BMI 21.58 kg/m     Repeat blood pressure was 128/80  Wt Readings from Last 3 Encounters:  10/17/18 118 lb (53.5 kg)  10/08/18 117 lb (53.1 kg)  10/03/17 116 lb (52.6 kg)   General: Alert, oriented, no distress.  Skin: normal turgor, no rashes, warm and dry HEENT: Normocephalic, atraumatic. Pupils equal round and reactive to light; sclera anicteric; extraocular muscles intact;  Nose without nasal septal hypertrophy Mouth/Parynx benign; Mallinpatti scale 2 Neck: No JVD, no carotid bruits; normal carotid upstroke Lungs: clear to ausculatation and percussion; no wheezing or rales Chest wall: without tenderness to palpitation Heart: PMI not displaced, RRR, s1 s2 normal, 1/6 systolic murmur, no diastolic murmur, intermittent  systolic click; no rubs, gallops, thrills, or heaves Abdomen: soft, nontender; no hepatosplenomehaly, BS+; abdominal aorta nontender and not dilated by palpation. Back: no CVA tenderness Pulses 2+ Musculoskeletal: full range of motion, normal strength, no joint deformities Extremities: no clubbing cyanosis or edema, Homan's sign negative  Neurologic: grossly nonfocal; Cranial nerves grossly wnl Psychologic: Normal mood and affect   ECG (independently read by me): Normal sinus rhythm at 63 bpm.  Mild RV conduction delay.  No ectopy.  Normal intervals.  October 2018 ECG (independently read by me): Normal sinus rhythm at 64 bpm.  September 2017 ECG (independently read by me): Normal sinus rhythm at 70 bpm.  Mild RV conduction delay.  Normal intervals.  February 2016 ECG (independently read by me): Normal sinus rhythm at 65 bpm.  No ectopy.  Normal intervals.  Mild RV conduction delay.  February 2015 ECG (independently read by me): Normal sinus rhythm at 68 beats per minute. Normal intervals. No ectopy  LABS: BMP Latest Ref Rng & Units 07/06/2016 07/12/2010  Glucose 65 - 99 mg/dL 93 81  BUN 6 - 24 mg/dL 15 8  Creatinine 0.57 - 1.00 mg/dL 0.75 0.66  BUN/Creat Ratio 9 - 23 20 -   Sodium 134 - 144 mmol/L 140 137  Potassium 3.5 - 5.2 mmol/L 5.0 3.7  Chloride 96 - 106 mmol/L 102 110  CO2 18 - 29 mmol/L 23 21  Calcium 8.7 - 10.2 mg/dL 9.5 7.6(L)   Hepatic Function Latest Ref Rng & Units 07/06/2016  Total Protein 6.0 - 8.5 g/dL 6.8  Albumin 3.5 - 5.5 g/dL 4.5  AST 0 - 40 IU/L 20  ALT 0 - 32 IU/L 12  Alk Phosphatase 39 - 117 IU/L 45  Total Bilirubin 0.0 - 1.2 mg/dL 0.4   CBC Latest Ref Rng & Units 07/06/2016 07/12/2010  WBC 3.4 - 10.8 x10E3/uL 6.3 8.6  Hemoglobin 11.1 - 15.9 g/dL 13.5 12.6  Hematocrit 34.0 - 46.6 % 38.9 36.6  Platelets 150 - 379 x10E3/uL 278 318   Lab Results  Component Value Date   MCV 91 07/06/2016   MCV 92.5 07/12/2010   Lab Results  Component Value Date   TSH 2.590 07/06/2016   No results found for: HGBA1C  Lipid Panel     Component Value Date/Time   CHOL 211 (H) 07/06/2016 0803   TRIG 89 07/06/2016 0803   HDL 95 07/06/2016 0803   CHOLHDL 2.2 07/06/2016 0803   LDLCALC 98 07/06/2016 0803    RADIOLOGY: No results found.  IMPRESSION:  1. Family history of ischemic heart disease   2. Mild hyperlipidemia   3. Palpitations   4. Systolic click   5. Gastroesophageal reflux disease without esophagitis     ASSESSMENT AND PLAN: Natalie Hooper is a very pleasant 48 year old female who has documented normal systolic and diastolic function with flat mitral valve closure and mild mitral regurgitation by an echo Doppler study in 2012. She was not found to have definitive mitral valve prolapse. She has an intermittent click on exam and a systolic murmur suggestive of MR. she has continued to do well.  She specifically denies any chest pain.  Her palpitations have been well controlled on her current regimen of metoprolol succinate 37.5 mg daily.  I reviewed with her recent laboratory done by Dr. Kary Kos.  Her most recent LDL was 122 which was increased from August 2017 when it was 98.  I had a lengthy discussion with her regarding data  concerning subclinical atherosclerosis.  She is not on any lipid-lowering therapy.  With her strong family history for CAD, I have recommended she undergo a cardiac calcium score.  If there is evidence for coronary calcification and if her score is above 0 I would suggest institution of lipid-lowering therapy particularly with her family history of extensive CAD in her mother.  I commended her on her daily exercise.  Her weight is excellent with a BMI of 21.6.  I will contact her regarding her regarding her cardiac CT. if lipid-lowering therapy is initiated I will see her in 6 months for reevaluation.  Otherwise I will see her in 1 year for an annual evaluation.   Troy Sine, MD, [facility]  10/19/2018 6:05 PM

## 2018-10-17 NOTE — Patient Instructions (Signed)
Medication Instructions:  Your physician recommends that you continue on your current medications as directed. Please refer to the Current Medication list given to you today.  If you need a refill on your cardiac medications before your next appointment, please call your pharmacy.   Testing/Procedures: CT coronary calcium score. This test is done at [address].   Coronary CalciumScan A coronary calcium scan is an imaging test used to look for deposits of calcium and other fatty materials (plaques) in the inner lining of the blood vessels of the heart (coronary arteries). These deposits of calcium and plaques can partly clog and narrow the coronary arteries without producing any symptoms or warning signs. This puts a person at risk for a heart attack. This test can detect these deposits before symptoms develop. Tell a health care provider about:  Any allergies you have.  All medicines you are taking, including vitamins, herbs, eye drops, creams, and over-the-counter medicines.  Any problems you or family members have had with anesthetic medicines.  Any blood disorders you have.  Any surgeries you have had.  Any medical conditions you have.  Whether you are pregnant or may be pregnant. What are the risks? Generally, this is a safe procedure. However, problems may occur, including:  Harm to a pregnant woman and her unborn baby. This test involves the use of radiation. Radiation exposure can be dangerous to a pregnant woman and her unborn baby. If you are pregnant, you generally should not have this procedure done.  Slight increase in the risk of cancer. This is because of the radiation involved in the test. What happens before the procedure? No preparation is needed for this procedure. What happens during the procedure?  You will undress and remove any jewelry around your neck or chest.  You will put on a hospital gown.  Sticky electrodes will be placed on  your chest. The electrodes will be connected to an electrocardiogram (ECG) machine to record a tracing of the electrical activity of your heart.  A CT scanner will take pictures of your heart. During this time, you will be asked to lie still and hold your breath for 2-3 seconds while a picture of your heart is being taken. The procedure may vary among health care providers and hospitals. What happens after the procedure?  You can get dressed.  You can return to your normal activities.  It is up to you to get the results of your test. Ask your health care provider, or the department that is doing the test, when your results will be ready. Summary  A coronary calcium scan is an imaging test used to look for deposits of calcium and other fatty materials (plaques) in the inner lining of the blood vessels of the heart (coronary arteries).  Generally, this is a safe procedure. Tell your health care provider if you are pregnant or may be pregnant.  No preparation is needed for this procedure.  A CT scanner will take pictures of your heart.  You can return to your normal activities after the scan is done. This information is not intended to replace advice given to you by your health care provider. Make sure you discuss any questions you have with your health care provider. Document Released: 04/20/2008 Document Revised: 09/11/2016 Document Reviewed: 09/11/2016 Elsevier Interactive Patient Education  2017 [place]: At [facility], you and your health needs are our priority.  As part of our continuing mission to provide you  with exceptional heart care, we have created designated Provider Care Teams.  These Care Teams include your primary Cardiologist (physician) and Advanced Practice Providers (APPs -  Physician Assistants and Nurse Practitioners) who all work together to provide you with the care you need, when you need it. You will need a follow up appointment in 6 months.   Please call our office 2 months in advance to schedule this appointment.  You may see Dr. Claiborne Billings or one of the following Advanced Practice Providers on your designated Care Team: [place], [place] . Fabian Sharp, PA-C

## 2018-10-19 ENCOUNTER — Encounter: Payer: Self-pay | Admitting: Cardiovascular Disease

## 2018-11-12 ENCOUNTER — Ambulatory Visit (INDEPENDENT_AMBULATORY_CARE_PROVIDER_SITE_OTHER)
Admission: RE | Admit: 2018-11-12 | Discharge: 2018-11-12 | Disposition: A | Discharge status: Home / Self Care | Payer: Self-pay | Source: Ambulatory Visit | Attending: Cardiovascular Disease | Admitting: Cardiovascular Disease

## 2018-11-12 DIAGNOSIS — Z8249 Family history of ischemic heart disease and other diseases of the circulatory system: Secondary | ICD-10-CM

## 2018-11-15 ENCOUNTER — Other Ambulatory Visit: Payer: Self-pay

## 2018-11-15 DIAGNOSIS — Z8249 Family history of ischemic heart disease and other diseases of the circulatory system: Secondary | ICD-10-CM

## 2018-11-15 DIAGNOSIS — R931 Abnormal findings on diagnostic imaging of heart and coronary circulation: Secondary | ICD-10-CM

## 2018-11-15 MED ORDER — ROSUVASTATIN CALCIUM 20 MG PO TABS: 20.0000 mg | ORAL_TABLET | Freq: Every day | ORAL | 3 refills | Status: DC

## 2018-11-29 ENCOUNTER — Telehealth: Payer: Self-pay | Admitting: Cardiovascular Disease

## 2018-11-29 NOTE — Telephone Encounter (Signed)
New Message:    patient returning call from 2 weeks ago. She states that she is to have a test. But patient insurance approve.please call patient concerning the test.

## 2018-11-29 NOTE — Telephone Encounter (Signed)
Called patient, she states she has yet to hear from her CT test to schedule , and wanted to make sure with Dr.Kelly that it was okay that she wait to have it done, as she will be flying in a few weeks for a trip and wanted to make sure she did not need to have it done sooner.   I advised that Dr.Kelly was out of office, until next week but could ask once he returned.  Patient verbalized understanding.

## 2018-12-05 NOTE — Telephone Encounter (Signed)
Okay to wait until the CT can be scheduled.  She is not having any symptoms.  Okay for her to go on her trip.

## 2018-12-06 ENCOUNTER — Telehealth: Payer: Self-pay | Admitting: Cardiovascular Disease

## 2018-12-06 NOTE — Telephone Encounter (Signed)
New message:     Patient calling about a test she is suppose to have it hospital, but her insurance have not proved it yet. Patient would like to know can this test wait or do she need it soon. Please call patient back.

## 2018-12-06 NOTE — Telephone Encounter (Signed)
Called patient, LVM advising of message. Left call back number if any questions.

## 2018-12-06 NOTE — Telephone Encounter (Signed)
Spoke with patient about coronary CT which is pending approval from insurance and scheduling. Natalie Hooper has notified her that insurance has not yet approved. She was concerned about the length of time between when her test was ordered and now, since it has not been scheduled. Notified her that MD states OK to wait and OK to travel per 11/29/2018 phone note. She voiced understanding.

## 2018-12-23 ENCOUNTER — Telehealth: Payer: Self-pay | Admitting: Cardiovascular Disease

## 2018-12-23 ENCOUNTER — Other Ambulatory Visit: Payer: Self-pay | Admitting: Cardiovascular Disease

## 2018-12-23 ENCOUNTER — Other Ambulatory Visit: Payer: Self-pay | Admitting: Obstetrics & Gynecology

## 2018-12-23 DIAGNOSIS — Z1231 Encounter for screening mammogram for malignant neoplasm of breast: Secondary | ICD-10-CM

## 2018-12-23 NOTE — Telephone Encounter (Signed)
New message   Patient states that CT Coronary Fractional Flow test was denied by insurance. Patient would like for this to be appealed to [employer]. Please call to discuss.

## 2018-12-24 NOTE — Telephone Encounter (Signed)
Spoke to patient, informed patient that  [employer] was working on the issue--  Patient wanted to speak to Dr Evette Georges nurse about the issue - patient states she wanted to give information to nurse.

## 2018-12-24 NOTE — Telephone Encounter (Signed)
I called patient, discussed of the plan that was already in place for the appeal. I will speak with Dr.Kelly and have him to give recommendations on why the CT is important for patient.  That information will be given to [employer] that is handling the appeal process.

## 2018-12-27 ENCOUNTER — Other Ambulatory Visit: Payer: Self-pay | Admitting: Obstetrics & Gynecology

## 2018-12-27 DIAGNOSIS — Z1231 Encounter for screening mammogram for malignant neoplasm of breast: Secondary | ICD-10-CM

## 2019-01-08 ENCOUNTER — Ambulatory Visit: Payer: PRIVATE HEALTH INSURANCE

## 2019-01-22 ENCOUNTER — Ambulatory Visit: Payer: PRIVATE HEALTH INSURANCE

## 2019-02-05 ENCOUNTER — Ambulatory Visit: Payer: PRIVATE HEALTH INSURANCE

## 2019-02-06 ENCOUNTER — Telehealth: Payer: Self-pay | Admitting: Cardiovascular Disease

## 2019-02-06 NOTE — Telephone Encounter (Signed)
New Message:  Patient calling about some results that her insurance keep denying her claim. Provider services  to request a peer to peer. Patient states Dr. Claiborne Billings needs to call. Please call patient.

## 2019-02-06 NOTE — Telephone Encounter (Signed)
I called patient, her CT was denied again, after appeal.   They are now requesting a peer to peer with Dr.Kelly to discuss. At this point patient is continuing to have symptoms, and will need to do something even if she pays out of pocket to have it done.   Dr.Kelly can you please call- [phone] and ask to do a peer to peer for this patients CT.

## 2019-02-07 NOTE — Telephone Encounter (Signed)
Member ID number if need for peer to peer:   [id number]

## 2019-02-10 NOTE — Telephone Encounter (Signed)
See below

## 2019-02-10 NOTE — Telephone Encounter (Signed)
I had spent 30 minutes on the phone trying to get approved; still denied.  Consider a GXTnuclear study as an alternative or stress echo, but all of these are deferred presently

## 2019-02-11 ENCOUNTER — Telehealth: Payer: Self-pay | Admitting: Cardiovascular Disease

## 2019-02-11 DIAGNOSIS — R931 Abnormal findings on diagnostic imaging of heart and coronary circulation: Secondary | ICD-10-CM

## 2019-02-11 DIAGNOSIS — R002 Palpitations: Secondary | ICD-10-CM

## 2019-02-11 NOTE — Telephone Encounter (Signed)
Pt called on Friday, she had spoken with her insurance company about a test being denied. Pt was given steps to get the test approved by her [employer].  She spoke with Almyra Free on Friday about the issue.

## 2019-02-11 NOTE — Telephone Encounter (Signed)
Called patient, advised that GXT was ordered.  She states she spoke with insurance and they would need all the paperwork information - any other testing, and that the CT was denied.   I advised I would send this over to [place] who has been helping [place] with this situation.   Patient was appreciative of the call, I advised she should hear soon on scheduling if approved by insurance.

## 2019-02-11 NOTE — Telephone Encounter (Signed)
I called, LVM for patient advising that after Dr.Kelly attempted the peer to peer they still denied testing. He recommended a GXT or a stress ECHO, I did order the GXT (and advised that patient was having symptoms and needed to have this scheduled.   Gave patient call back number if questions.

## 2019-02-11 NOTE — Telephone Encounter (Signed)
New Message    Pt is calling Natalie Hooper back   Please call

## 2019-04-11 ENCOUNTER — Telehealth: Payer: Self-pay | Admitting: Cardiovascular Disease

## 2019-04-11 NOTE — Telephone Encounter (Signed)
If we do the Covid testing- can she have this GXT completed?  Or are they on hold in general?   Thank you!

## 2019-04-11 NOTE — Telephone Encounter (Signed)
Spoke with patient on yesterday regarding her CT that was denied and that Dr. Claiborne Billings had order another test.   I told her that I will have to review her chart and gave her a call back.   Dr. Claiborne Billings has ordered a GXT - at this time due to COVID-19 the test for the GXT is on hold for not until further notice.  Please advise.  I will let the patient know that someone will call her back.

## 2019-04-15 NOTE — Telephone Encounter (Signed)
Please advise on how to proceed as no GXT's are being done at this time.  Will return call to patient once I know how to proceed from [facility].

## 2019-04-15 NOTE — Telephone Encounter (Signed)
Spoke with [place] and [place] regarding doing a GXT per Dr. Claiborne Billings.   No GXT will be done at this time per Dr. Meda Coffee until further notice.

## 2019-05-06 ENCOUNTER — Telehealth: Payer: Self-pay

## 2019-05-06 NOTE — Telephone Encounter (Signed)
Patient called in, since they are not doing any stress test right now due to COVID-patient would like to know if she could do another test that was mentioned before. She states she is still having the tightness in her chest, and does not want to have to go to ER if she can stand it.   Advised Dr.Kelly was not here today- but I would send him this message to discuss tomorrow when he returns to office.

## 2019-05-08 ENCOUNTER — Ambulatory Visit (INDEPENDENT_AMBULATORY_CARE_PROVIDER_SITE_OTHER): Payer: PRIVATE HEALTH INSURANCE

## 2019-05-08 ENCOUNTER — Other Ambulatory Visit: Payer: Self-pay

## 2019-05-08 DIAGNOSIS — Z1231 Encounter for screening mammogram for malignant neoplasm of breast: Secondary | ICD-10-CM

## 2019-05-08 NOTE — Telephone Encounter (Signed)
Since we are not able to schedule the patient for an exercise treadmill test with nuclear or stress echo and if she is having symptomatology recommend follow-up office visit so I can redocument and hopefully then can arrange for her to have CTA with insurance approval since they denied her initial studies

## 2019-05-13 NOTE — Telephone Encounter (Signed)
Called patient- advised of message from [facility]- advised I had a spot to place patient for 07/17- if she could make that spot to call back.  Left call back number.

## 2019-05-14 NOTE — Telephone Encounter (Signed)
Spoke with patient regarding appointment for Friday 05/23/19 with Dr. Claiborne Billings.  Patient states she will be out of town next week.  She will call me back and let me know if she can work things out to come in on the 17th--she will let me know if she will keep the appointment for her GXT on the 21st or if she will schedule later in the summer to see Dr. Linus Salmons.

## 2019-05-14 NOTE — Telephone Encounter (Signed)
Please schedule patient for appointment below- and try to let her know. She should be seen so we can hopefully get her CTA scheduled.

## 2019-05-15 ENCOUNTER — Encounter: Payer: Self-pay | Admitting: Cardiology

## 2019-05-15 ENCOUNTER — Ambulatory Visit: Payer: PRIVATE HEALTH INSURANCE | Admitting: Cardiology

## 2019-05-15 ENCOUNTER — Other Ambulatory Visit: Payer: Self-pay

## 2019-05-15 ENCOUNTER — Telehealth: Payer: Self-pay | Admitting: Cardiology

## 2019-05-15 DIAGNOSIS — R0789 Other chest pain: Secondary | ICD-10-CM | POA: Diagnosis not present

## 2019-05-15 DIAGNOSIS — R002 Palpitations: Secondary | ICD-10-CM | POA: Diagnosis not present

## 2019-05-15 DIAGNOSIS — Z8249 Family history of ischemic heart disease and other diseases of the circulatory system: Secondary | ICD-10-CM | POA: Diagnosis not present

## 2019-05-15 DIAGNOSIS — I2510 Atherosclerotic heart disease of native coronary artery without angina pectoris: Secondary | ICD-10-CM | POA: Insufficient documentation

## 2019-05-15 NOTE — Assessment & Plan Note (Signed)
Mother had CABG, pt of Dr Evette Georges

## 2019-05-15 NOTE — Assessment & Plan Note (Signed)
On beta blocker rx

## 2019-05-15 NOTE — Patient Instructions (Signed)
Medication Instructions:  Your physician recommends that you continue on your current medications as directed. Please refer to the Current Medication list given to you today. If you need a refill on your cardiac medications before your next appointment, please call your pharmacy.   Lab work: NONE  If you have labs (blood work) drawn today and your tests are completely normal, you will receive your results only by: Marland Kitchen MyChart Message (if you have MyChart) OR . A paper copy in the mail If you have any lab test that is abnormal or we need to change your treatment, we will call you to review the results.  Testing/Procedures: THE NIGHT BEFORE YOUR STRESS TEST HOLD YOUR METOPROLOL.  Follow-Up: At [facility], you and your health needs are our priority.  As part of our continuing mission to provide you with exceptional heart care, we have created designated Provider Care Teams.  These Care Teams include your primary Cardiologist (physician) and Advanced Practice Providers (APPs -  Physician Assistants and Nurse Practitioners) who all work together to provide you with the care you need, when you need it. . FOLLOW UP WITH DR Shelva Majestic ONLY  Any Other Special Instructions Will Be Listed Below (If Applicable).

## 2019-05-15 NOTE — Assessment & Plan Note (Signed)
Coronary Ca++ on CT scan 

## 2019-05-15 NOTE — Telephone Encounter (Signed)
LVM, reminding pt of her appt for 05-15-19.

## 2019-05-15 NOTE — Progress Notes (Signed)
Cardiology Office Note:    Date:  05/15/2019   ID:  Natalie Hooper, DOB 1971/06/01, MRN [id number]  PCP:  Natalie Pink, MD  Cardiologist:  Natalie Hooper Electrophysiologist:  None   Referring MD: Natalie Pink, MD     History of Present Illness:    Natalie Hooper is a 48 y.o. female with a hx of palpitations.  Her mother is a patient of Natalie. Evette Hooper.  Patient had a coronary calcium scoring in January 2020.  This revealed LAD calcification.  Despite multiple attempts she has been unable to be approved for coronary CTA with FFR.  Options for other functional studies have been discussed but have been limited these because of the [place] pandemic.  Recently exercise studies have been opened up in our office, the patient is scheduled for a treadmill 05/27/2019.  She has a follow-up visit with Natalie. Claiborne Hooper in September.  Since her coronary calcium scoring she is developed intermittent chest tightness.  It is not exertional.  She is able to walk a couple miles a day without problems.  She is also had an increase in palpitations at night.  Past Medical History:  Diagnosis Date  . Arrhythmia    Hx of Palp.    Past Surgical History:  Procedure Laterality Date  . DILATION AND CURETTAGE OF UTERUS    . DOBUTAMINE STRESS ECHO  05/22/2011   Normal treadmill,normal stress echo  . DOPPLER ECHOCARDIOGRAPHY  10/03/2011   EF = >55%,mild mitral regurg  . Event Monitor  06/24/2008   NSR,Sinus Tach  . MYOMECTOMY  2011  . TONSILLECTOMY     age 51    Current Medications: Current Meds  Medication Sig  . acetaminophen (TYLENOL) 325 MG tablet Take 650 mg by mouth as needed.  Marland Kitchen ibuprofen (ADVIL,MOTRIN) 200 MG tablet Take 200 mg by mouth as needed.  Marland Kitchen levonorgestrel (MIRENA) 20 MCG/24HR IUD 1 each by Intrauterine route once.  . metoprolol succinate (TOPROL-XL) 25 MG 24 hr tablet TAKE 1.5 TABLETS (37.5 MG TOTAL) BY MOUTH DAILY.  Marland Kitchen omeprazole (PRILOSEC) 20 MG capsule Take 1 capsule by mouth as needed.     Allergies:    Patient has no known allergies.   Social History   Socioeconomic History  . Marital status: Married    Spouse name: Not on file  . Number of children: Not on file  . Years of education: Not on file  . Highest education level: Not on file  Occupational History  . Not on file  Social Needs  . Financial resource strain: Not on file  . Food insecurity    Worry: Not on file    Inability: Not on file  . Transportation needs    Medical: Not on file    Non-medical: Not on file  Tobacco Use  . Smoking status: Former Smoker    Years: 5.00    Types: Cigarettes    Quit date: 12/10/1996    Years since quitting: 22.4  . Smokeless tobacco: Never Used  Substance and Sexual Activity  . Alcohol use: Yes    Comment: occas  . Drug use: No  . Sexual activity: Yes    Partners: Male    Comment: intercourse age 20, less than 5 sexual partners,des neg  Lifestyle  . Physical activity    Days per week: Not on file    Minutes per session: Not on file  . Stress: Not on file  Relationships  . Social Herbalist on  phone: Not on file    Gets together: Not on file    Attends religious service: Not on file    Active member of club or organization: Not on file    Attends meetings of clubs or organizations: Not on file    Relationship status: Not on file  Other Topics Concern  . Not on file  Social History Narrative  . Not on file     Family History: The patient's family history includes Breast cancer in her maternal grandmother; Colon cancer in her maternal uncle and mother; Diabetes in her maternal aunt and maternal grandfather; Heart disease in her maternal grandmother and maternal uncle; Heart disease (age of onset: 73) in her maternal grandfather; Heart disease (age of onset: 17) in her mother; Hypertension in her maternal grandfather, maternal grandmother, and mother.  ROS:   Please see the history of present illness.  All other systems reviewed and are  negative.  EKGs/Labs/Other Studies Reviewed:    The following studies were reviewed today: Coronary CT Ca++ scoring Jan 2020  Recent Labs: No results found for requested labs within last 8760 hours.  Recent Lipid Panel    Component Value Date/Time   CHOL 211 (H) 07/06/2016 0803   TRIG 89 07/06/2016 0803   HDL 95 07/06/2016 0803   CHOLHDL 2.2 07/06/2016 0803   LDLCALC 98 07/06/2016 0803    Physical Exam:    VS:  BP 134/82   Pulse 79   Temp 98.2 F (36.8 C)   Ht 5\' 2"  (1.575 m)   Wt 116 lb 12.8 oz (53 kg)   SpO2 99%   BMI 21.36 kg/m     Wt Readings from Last 3 Encounters:  05/15/19 116 lb 12.8 oz (53 kg)  10/17/18 118 lb (53.5 kg)  10/08/18 117 lb (53.1 kg)     GEN:  Well nourished, well developed in no acute distress HEENT: Normal NECK: No JVD; No carotid bruits LYMPHATICS: No lymphadenopathy CARDIAC: RRR, no murmurs, rubs, gallops RESPIRATORY:  Clear to auscultation without rales, wheezing or rhonchi  ABDOMEN: Soft, non-tender, non-distended MUSCULOSKELETAL:  No edema; No deformity  SKIN: Warm and dry NEUROLOGIC:  Alert and oriented x 3 PSYCHIATRIC:  Normal affect   ASSESSMENT:    CAD (coronary artery disease) Coronary Ca++ on CT scan  FH: CAD (coronary artery disease) Mother had CABG, pt of Natalie Hooper  Palpitations On beta blocker rx  Chest tightness Not exertional  PLAN:    Keep appointment for GXT 7/21.  Hoild Toprol 7/20 (she takes it a night).  F/U with Natalie Hooper (she request only Natalie Hooper) as scheduled.    Medication Adjustments/Labs and Tests Ordered: Current medicines are reviewed at length with the patient today.  Concerns regarding medicines are outlined above.  No orders of the defined types were placed in this encounter.  No orders of the defined types were placed in this encounter.   There are no Patient Instructions on file for this visit.   Signed, Natalie Ransom, PA-C  05/15/2019 3:09 PM    [facility]

## 2019-05-15 NOTE — Assessment & Plan Note (Signed)
Not exertional?

## 2019-05-20 ENCOUNTER — Telehealth: Payer: Self-pay | Admitting: Cardiovascular Disease

## 2019-05-20 ENCOUNTER — Other Ambulatory Visit: Payer: Self-pay

## 2019-05-20 NOTE — Progress Notes (Signed)
Ordered Covid for GXT test on 07/21.

## 2019-05-20 NOTE — Telephone Encounter (Signed)
Attempted to contact patient, will call back.

## 2019-05-20 NOTE — Telephone Encounter (Signed)
New message   Patient states that she is returning call to setup covid testing. Please call.

## 2019-05-20 NOTE — Telephone Encounter (Signed)
Called patient back and scheduled her covid testing for July 18th at 1235pm at [facility]. Informed patient she will need to self- quarantine after she has been tested until her stress test. Patient verbally understands. No further questions.

## 2019-05-21 NOTE — Telephone Encounter (Signed)
Thank you :)

## 2019-05-22 ENCOUNTER — Telehealth (HOSPITAL_COMMUNITY): Payer: Self-pay

## 2019-05-22 NOTE — Telephone Encounter (Signed)
Encounter complete. 

## 2019-05-24 ENCOUNTER — Other Ambulatory Visit (HOSPITAL_COMMUNITY)
Admission: RE | Admit: 2019-05-24 | Discharge: 2019-05-24 | Disposition: A | Discharge status: Home / Self Care | Payer: PRIVATE HEALTH INSURANCE | Source: Ambulatory Visit | Attending: Cardiovascular Disease | Admitting: Cardiovascular Disease

## 2019-05-24 DIAGNOSIS — Z1159 Encounter for screening for other viral diseases: Secondary | ICD-10-CM | POA: Insufficient documentation

## 2019-05-24 LAB — SARS CORONAVIRUS 2 (TAT 6-24 HRS): SARS Coronavirus 2: NEGATIVE

## 2019-05-27 ENCOUNTER — Encounter (INDEPENDENT_AMBULATORY_CARE_PROVIDER_SITE_OTHER): Payer: Self-pay

## 2019-05-27 ENCOUNTER — Other Ambulatory Visit: Payer: Self-pay

## 2019-05-27 ENCOUNTER — Ambulatory Visit (HOSPITAL_COMMUNITY)
Admission: RE | Admit: 2019-05-27 | Discharge: 2019-05-27 | Disposition: A | Discharge status: Home / Self Care | Payer: PRIVATE HEALTH INSURANCE | Source: Ambulatory Visit | Attending: Cardiovascular Disease | Admitting: Cardiovascular Disease

## 2019-05-27 DIAGNOSIS — R002 Palpitations: Secondary | ICD-10-CM | POA: Insufficient documentation

## 2019-05-27 DIAGNOSIS — R931 Abnormal findings on diagnostic imaging of heart and coronary circulation: Secondary | ICD-10-CM | POA: Diagnosis present

## 2019-05-27 LAB — EXERCISE TOLERANCE TEST
Estimated workload: 12.9 METS
Exercise duration (min): 10 min
Exercise duration (sec): 45 s
MPHR: 171 {beats}/min
Peak HR: 150 {beats}/min
Percent HR: 87 %
RPE: 15
Rest HR: 74 {beats}/min

## 2019-06-18 ENCOUNTER — Other Ambulatory Visit: Payer: Self-pay | Admitting: Cardiovascular Disease

## 2019-06-30 ENCOUNTER — Telehealth: Payer: Self-pay | Admitting: Cardiovascular Disease

## 2019-06-30 NOTE — Telephone Encounter (Signed)
Spoke to pt and gave information for virtual appt tomorrow. Pt stated she thought it was an in-person appointment, which is what she wanted. She stated she will keep the appointment and would like a telephone visit. She stated she will review consent from Mychart message and did verbally consent to telephone visit with Dr. Claiborne Billings tomorrow. She stated she will get her BP and weight today to have available for appt tomorrow. Pt verbalized thanks for the call.

## 2019-07-01 ENCOUNTER — Telehealth (INDEPENDENT_AMBULATORY_CARE_PROVIDER_SITE_OTHER): Payer: PRIVATE HEALTH INSURANCE | Admitting: Cardiovascular Disease

## 2019-07-01 ENCOUNTER — Encounter: Payer: Self-pay | Admitting: Cardiovascular Disease

## 2019-07-01 VITALS — BP 152/86 | HR 58 | Ht 62.0 in | Wt 115.0 lb

## 2019-07-01 DIAGNOSIS — R002 Palpitations: Secondary | ICD-10-CM | POA: Diagnosis not present

## 2019-07-01 DIAGNOSIS — R931 Abnormal findings on diagnostic imaging of heart and coronary circulation: Secondary | ICD-10-CM

## 2019-07-01 DIAGNOSIS — R0789 Other chest pain: Secondary | ICD-10-CM | POA: Diagnosis not present

## 2019-07-01 NOTE — Progress Notes (Signed)
Virtual Visit via Telephone Note   This visit type was conducted due to national recommendations for restrictions regarding the COVID-19 Pandemic (e.g. social distancing) in an effort to limit this patient's exposure and mitigate transmission in our community.  Due to her co-morbid illnesses, this patient is at least at moderate risk for complications without adequate follow up.  This format is felt to be most appropriate for this patient at this time.  The patient did not have access to video technology/had technical difficulties with video requiring transitioning to audio format only (telephone).  All issues noted in this document were discussed and addressed.  No physical exam could be performed with this format.  Please refer to the patient's chart for her  consent to telehealth for [facility].   Date:  07/01/2019   ID:  Natalie Hooper, DOB 1971/09/02, MRN [id number]  Patient Location: Home Provider Location: Home  PCP:  [place] Pink, MD  Cardiologist:  Shelva Majestic, MD Electrophysiologist:  None   Evaluation Performed:  Follow-Up Visit  Chief Complaint:  9 month F/U  History of Present Illness:    Natalie Hooper is a 48 y.o. female who has a history of palpitations and family history for coronary artery disease. An echo Doppler study November 2012 showed normal systolic and diastolic function. She had flat mitral valve coaptation without definitive prolapse and mild mitral regurgitation with trivial tricuspid regurgitation. She had normal pulmonary pressures.  She has been on metoprolol succinate at 37.5 mg daily which has essentially controlled her palpitations. There is a very rare instance where she does note an occasional palpitation and she has taken an extra half of this metoprolol.  This usually occurs when she is resting under periods of increased stress.    I  saw her in October 2018 at which time, she was doing well.  Palpitations were controlled with metoprolol.  She  was exercising daily and remained active.    I last saw her in December 2019 at which time she remained stable and was walking several miles and exercising daily.  She denied any episodes of chest tightness or pressure.  She had undergone laboratory by her primary physician, Dr. Kary Kos and total cholesterol was 210, triglycerides 77, HDL 72, and LDL 122.  She is not on any therapy for lipids.  She has a strong family history for CAD in her mother who had significant multivessel CAD and required CABG revascularization.  She has rare episodes of GERD for which she takes omeprazole as needed.   She was concerned about underlying coronary artery disease and I recommended a screening coronary calcium score which was done in January 2020.  This demonstrated dense calcification in the proximal to mid LAD and her calcium score was elevated at 122, representing 99th percentile based on age and sex matched controls.  At that time, she also began to notice a vague sensation of chest fullness. Despite multiple attempts she has been unable to be approved for coronary CTA with FFR.  Options for other functional studies have been discussed but have been limited these because of the [place] pandemic. She saw Kerin Ransom, [facility] on 05/15/2019.  She ultimately underwent a routine grade exercise treadmill study since no other evaluations were approved by her insurance.  On the exercise treadmill test, she was able to exercise to a 12.9-minute workload and 10 minutes and 50 seconds of exercise.  Peak heart rate was with it, representing 1.  She developed a vague chest  sensation at stress but this was unassociated with ECG changes of ischemia.  With her abnormal cardiac calcium score, I recommended initiation of statin therapy and since February has been on rosuvastatin 20 mg daily which she has tolerated well.  Previously she had been hesitant to initiate statin therapy.  I have discussed with her the importance of achieving an LDL  less than 70 in an attempt to potentially induce plaque stability and with ultimate regression.  Recently she also had noticed some vague palpitations which have improved with further titration of Toprol-XL to 50 mg daily.  She presents for follow-up evaluation.  The patient does not have symptoms concerning for COVID-19 infection (fever, chills, cough, or new shortness of breath).    Past Medical History:  Diagnosis Date  . Arrhythmia    Hx of Palp.   Past Surgical History:  Procedure Laterality Date  . DILATION AND CURETTAGE OF UTERUS    . DOBUTAMINE STRESS ECHO  05/22/2011   Normal treadmill,normal stress echo  . DOPPLER ECHOCARDIOGRAPHY  10/03/2011   EF = >55%,mild mitral regurg  . Event Monitor  06/24/2008   NSR,Sinus Tach  . MYOMECTOMY  2011  . TONSILLECTOMY     age 62     Current Meds  Medication Sig  . acetaminophen (TYLENOL) 325 MG tablet Take 650 mg by mouth as needed.  Marland Kitchen ibuprofen (ADVIL,MOTRIN) 200 MG tablet Take 200 mg by mouth as needed.  Marland Kitchen levonorgestrel (MIRENA) 20 MCG/24HR IUD 1 each by Intrauterine route once.  . metoprolol succinate (TOPROL-XL) 25 MG 24 hr tablet TAKE 1.5 TABLETS (37.5 MG TOTAL) BY MOUTH DAILY.  Marland Kitchen omeprazole (PRILOSEC) 20 MG capsule Take 1 capsule by mouth as needed.     Allergies:   Patient has no known allergies.   Social History   Tobacco Use  . Smoking status: Former Smoker    Years: 5.00    Types: Cigarettes    Quit date: 12/10/1996    Years since quitting: 22.5  . Smokeless tobacco: Never Used  Substance Use Topics  . Alcohol use: Yes    Comment: occas  . Drug use: No     Family Hx: The patient's family history includes Breast cancer in her maternal grandmother; Colon cancer in her maternal uncle and mother; Diabetes in her maternal aunt and maternal grandfather; Heart disease in her maternal grandmother and maternal uncle; Heart disease (age of onset: 27) in her maternal grandfather; Heart disease (age of onset: 77) in her  mother; Hypertension in her maternal grandfather, maternal grandmother, and mother.  ROS:   Please see the history of present illness.    She denies any fevers chills night sweats. She denies any exertional dyspnea but at times does note a chest sensation which she thinks may be a sensation of numbness Palpitations have improved with beta-blocker therapy She denies any wheezing She denies any GI or GU issues She denies edema She denies neurologic symptoms She is sleeping well All other systems reviewed and are negative.   Prior CV studies:   The following studies were reviewed today:  CT cardiac scoring 11/12/2018 : FINDINGS: Non-cardiac: See separate report from [facility].  Ascending aorta: Normal diameter 2.9 cm  Pericardium: Normal  Coronary arteries: Dense calcium noted in proximal and mid LAD  IMPRESSION: Coronary calcium score of 122. This was 57 th percentile for age and sex matched control.  Stress Findings  ECG Baseline ECG exhibits normal sinus rhythm..  Stress Findings The patient exercised following  the [place] protocol.   The patient experienced exercise-limiting angina during the stress test.   The test was stopped because  the patient complained of fatigue and atypical chest pain.   Blood pressure and heart rate demonstrated a normal response to exercise. Blood pressure demonstrated a normal response to exercise. Overall, the patient's exercise capacity was normal.   85% of maximum heart rate was achieved after 10 minutes.  Recovery time:  5 minutes.   [facility] Treadmill Score: intermediate risk The patient's response to exercise was adequate for diagnosis.  Response to Stress There was no ST segment deviation noted during stress.  Arrhythmias during stress:  none.   Arrhythmias during recovery:  none.     There were no significant arrhythmias noted during the test.   ECG was interpretable and there was no significant change from baseline.    Stress Measurements  Baseline Vitals  Rest HR 74 bpm    Rest BP 134/88 mmHg    Exercise Time  Exercise duration (min) 10 min    Exercise duration (sec) 45 sec    Peak Stress Vitals  Peak HR 150 bpm    Peak BP 187/90 mmHg    Exercise Data  MPHR 171 bpm    Percent HR 87 %    RPE 15     Estimated workload 12.9 METS           Labs/Other Tests and Data Reviewed:    EKG: An ECG was not obtained today but I personally reviewed her last ECG normal sinus rhythm at 63 bpm, mild RV conduction delay, normal intervals.  I also reviewed her ECG strips from her routine treadmill test.  Recent Labs: No results found for requested labs within last 8760 hours.   Recent Lipid Panel Lab Results  Component Value Date/Time   CHOL 211 (H) 07/06/2016 08:03 AM   TRIG 89 07/06/2016 08:03 AM   HDL 95 07/06/2016 08:03 AM   CHOLHDL 2.2 07/06/2016 08:03 AM   LDLCALC 98 07/06/2016 08:03 AM    Wt Readings from Last 3 Encounters:  07/01/19 115 lb (52.2 kg)  05/15/19 116 lb 12.8 oz (53 kg)  10/17/18 118 lb (53.5 kg)     Objective:    Vital Signs:  BP (!) 152/86   Pulse (!) 58   Ht _0  (1.575 m)   Wt 115 lb (52.2 kg)   BMI 21.03 kg/m    Since this was a phone visit I could not physically inspect the patient. Typically she states her blood pressure is controlled in the 120s to 130 range.  Yesterday she was rushing and went to [place] and in the midst of all her activity had her blood pressure recorded at the pharmacy which was elevated as noted above but this has been atypical. She is breathing well and is not having any audible wheezing She denies any chest pressure to palpation Her rhythm is stable without irregularity She denies abdominal discomfort She denies myalgias and is tolerating Crestor There are no neurologic symptoms She has normal affect and mood  ASSESSMENT & PLAN:    1. Abnormal coronary calcium score; she was found to have dense calcification in the  proximal to mid LAD with a calcium score of 122.  I discussed with her the importance of aggressive lipid-lowering therapy with target LDL less than 70.  Since initiating rosuvastatin she underwent repeat laboratory with her primary physician in June and LDL cholesterol improved from 122 down to 70. 2.  Intermittent chest pain: I reviewed the data of her routine treadmill test which she was able to exercise to a 12.9 met workload.  She did experience some mild chest sensation at the end of exercise which was unassociated with ECG changes.  I discussed with her the sensitivity of routine treadmill testing may only be approximately 70%.  She recently was further titrated metoprolol to 50 mg daily we will continue this therapy at present.  If she continues to experience chest pain I will pursue further attempts at coronary CTA assessment or nuclear imaging. 3. Hyperlipidemia: Since undergoing her cardiac CT with calcium scoring she has been on rosuvastatin 20 mg.  She is tolerating this well.  In June 2020 LDL cholesterol had reduced to 70 from 122.  I am recommending that repeat laboratory be obtained in September/October to make certain she is achieving goal less than 70 for aggressive attempts at reducing plaque regression. 4. Palpitations: Currently improved with titration of Toprol-XL from 37.5 up to 2 mg daily.    COVID-19 Education: The signs and symptoms of COVID-19 were discussed with the patient and how to seek care for testing (follow up with PCP or arrange E-visit).  The importance of social distancing was discussed today.  Time:   Today, I have spent 25 minutes with the patient with telehealth technology discussing the above problems.     Medication Adjustments/Labs and Tests Ordered: Current medicines are reviewed at length with the patient today.  Concerns regarding medicines are outlined above.   Tests Ordered: No orders of the defined types were placed in this encounter.    Medication Changes: No orders of the defined types were placed in this encounter.   Follow Up: She will undergo repeat comprehensive metabolic panel and lipid studies in late September 2020 with plans for follow-up office visit in October 2020  Signed, Shelva Majestic, MD  07/01/2019 11:06 AM    [place]

## 2019-07-01 NOTE — Patient Instructions (Addendum)
Medication Instructions:  The current medical regimen is effective;  continue present plan and medications.  If you need a refill on your cardiac medications before your next appointment, please call your pharmacy.   Lab work: CMET and LIPID in September If you have labs (blood work) drawn today and your tests are completely normal, you will receive your results only by: Marland Kitchen MyChart Message (if you have MyChart) OR . A paper copy in the mail If you have any lab test that is abnormal or we need to change your treatment, we will call you to review the results.   Follow-Up: At [facility], you and your health needs are our priority.  As part of our continuing mission to provide you with exceptional heart care, we have created designated Provider Care Teams.  These Care Teams include your primary Cardiologist (physician) and Advanced Practice Providers (APPs -  Physician Assistants and Nurse Practitioners) who all work together to provide you with the care you need, when you need it. You will need a follow up appointment in 2 months. You may see Dr.Kelly or one of the following Advanced Practice Providers on your designated Care Team: Almyra Deforest, [place] . Fabian Sharp, PA-C      cmet lipid- September. october

## 2019-07-28 ENCOUNTER — Ambulatory Visit: Payer: PRIVATE HEALTH INSURANCE | Admitting: Cardiovascular Disease

## 2019-08-29 ENCOUNTER — Ambulatory Visit: Payer: PRIVATE HEALTH INSURANCE | Admitting: Cardiovascular Disease

## 2019-09-25 ENCOUNTER — Telehealth: Payer: Self-pay | Admitting: Cardiovascular Disease

## 2019-09-25 NOTE — Telephone Encounter (Signed)
New message   Patient would like to have labs order to be transferred to [facility] in [place], [place]. Please advise.

## 2019-09-25 NOTE — Telephone Encounter (Signed)
Pt advised she can have labs drawn at the [facility] in [place]... CMET and Lipid.

## 2019-10-01 LAB — COMPREHENSIVE METABOLIC PANEL
ALT: 11 IU/L (ref 0–32)
AST: 16 IU/L (ref 0–40)
Albumin/Globulin Ratio: 2.2 (ref 1.2–2.2)
Albumin: 4.3 g/dL (ref 3.8–4.8)
Alkaline Phosphatase: 47 IU/L (ref 39–117)
BUN/Creatinine Ratio: 17 (ref 9–23)
BUN: 13 mg/dL (ref 6–24)
Bilirubin Total: 0.4 mg/dL (ref 0.0–1.2)
CO2: 25 mmol/L (ref 20–29)
Calcium: 9.2 mg/dL (ref 8.7–10.2)
Chloride: 102 mmol/L (ref 96–106)
Creatinine, Ser: 0.75 mg/dL (ref 0.57–1.00)
GFR calc Af Amer: 107 mL/min/{1.73_m2} (ref >59)
GFR calc non Af Amer: 93 mL/min/{1.73_m2} (ref >59)
Globulin, Total: 2 g/dL (ref 1.5–4.5)
Glucose: 87 mg/dL (ref 65–99)
Potassium: 3.9 mmol/L (ref 3.5–5.2)
Sodium: 138 mmol/L (ref 134–144)
Total Protein: 6.3 g/dL (ref 6.0–8.5)

## 2019-10-01 LAB — LIPID PANEL
Chol/HDL Ratio: 2 ratio (ref 0.0–4.4)
Cholesterol, Total: 156 mg/dL (ref 100–199)
HDL: 80 mg/dL (ref >39)
LDL Chol Calc (NIH): 58 mg/dL (ref 0–99)
Triglycerides: 102 mg/dL (ref 0–149)
VLDL Cholesterol Cal: 18 mg/dL (ref 5–40)

## 2019-10-08 ENCOUNTER — Encounter: Payer: Self-pay | Admitting: Cardiovascular Disease

## 2019-10-08 ENCOUNTER — Ambulatory Visit (INDEPENDENT_AMBULATORY_CARE_PROVIDER_SITE_OTHER): Payer: PRIVATE HEALTH INSURANCE | Admitting: Cardiovascular Disease

## 2019-10-08 ENCOUNTER — Other Ambulatory Visit: Payer: Self-pay

## 2019-10-08 VITALS — BP 134/82 | HR 65 | Ht 62.0 in | Wt 120.4 lb

## 2019-10-08 DIAGNOSIS — R002 Palpitations: Secondary | ICD-10-CM | POA: Diagnosis not present

## 2019-10-08 DIAGNOSIS — Z8249 Family history of ischemic heart disease and other diseases of the circulatory system: Secondary | ICD-10-CM | POA: Diagnosis not present

## 2019-10-08 DIAGNOSIS — R931 Abnormal findings on diagnostic imaging of heart and coronary circulation: Secondary | ICD-10-CM

## 2019-10-08 DIAGNOSIS — E785 Hyperlipidemia, unspecified: Secondary | ICD-10-CM

## 2019-10-08 NOTE — Patient Instructions (Signed)
Medication Instructions:  Continue same medications *If you need a refill on your cardiac medications before your next appointment, please call your pharmacy*  Lab Work: None ordered   Testing/Procedures: None ordered  Follow-Up: At [facility], you and your health needs are our priority.  As part of our continuing mission to provide you with exceptional heart care, we have created designated Provider Care Teams.  These Care Teams include your primary Cardiologist (physician) and Advanced Practice Providers (APPs -  Physician Assistants and Nurse Practitioners) who all work together to provide you with the care you need, when you need it.  Your next appointment:  1 year  Call in Oct to schedule Dec appointment   The format for your next appointment:  Office   Provider:  Dr.Kelly   

## 2019-10-08 NOTE — Progress Notes (Signed)
Patient ID: Natalie Hooper, female   DOB: Apr 02, 1971, 48 y.o.   MRN: [id number]      Primary M.D.: [name]  HPI: Natalie Hooper is a 48 y.o. female who presents for a 4 month follow-up cardiology evaluation. She is the daughter of my patient Natalie Hooper.  Natalie Hooper has a history of palpitations.  An echo Doppler study November 2012 showed normal systolic and diastolic function. She had flat mitral valve coaptation without definitive prolapse and mild mitral regurgitation with trivial tricuspid regurgitation. She had normal pulmonary pressures.  She has been on metoprolol succinate at 37.5 mg daily which has essentially controlled her palpitations. There is a very rare instance where she does note an occasional palpitation and she has taken an extra half of this metoprolol.  This usually occurs when she is resting under periods of increased stress.    I last saw her in October 2018.  At that time, she was doing well.  Palpitations were controlled with metoprolol.  She was sizing daily and remained active.    When I saw her in December 2019 she remained active and was often walking several miles and exercising daily.  She denied chest tightness or pressure.  She often walks several miles and exercises daily.  She denies any episodes of chest tightness or pressure.  She had undergone laboratory by her primary physician, Dr. Kary Kos and total cholesterol was 210, triglycerides 77, HDL 72, and LDL 122. She is not on any therapy for lipids. She has a strong family history for CAD in her mother who had significant multivessel CAD and required CABG revascularization. She has rare episodes of GERD for which she takes omeprazole as needed.   She was concerned about underlying coronary artery disease and I recommended a screening coronary calcium score which was done in January 2020.  This demonstrated dense calcification in the proximal to mid LAD and her calcium score was elevated at 122,  representing 99th percentile based on age and sex matched controls.  At that time, she also began to notice a vague sensation of chest fullness.Despite multiple attempts she has been unable to be approved for coronary CTA with FFR. Options for other functional studies have been discussed but have been limited these because of the [place] pandemic. She saw Natalie Hooper, [facility] on [facility]/15/2019.  She ultimately underwent a routine grade exercise treadmill study since no other evaluations were approved by her insurance.  On the exercise treadmill test, she was able to exercise to a 12.9-met workload and 10 minutes and 50 seconds of exercise.  Peak heart rate was 171, representing 87% of predicted maximum.  She developed a vague chest sensation at stress but this was unassociated with ECG changes of ischemia.  With her abnormal cardiac calcium score, I recommended initiation of statin therapy and since February has been on rosuvastatin 20 mg daily which she has tolerated well.  Previously she had been hesitant to initiate statin therapy.  I have discussed with her the importance of achieving an LDL less than 70 in an attempt to potentially induce plaque stability and with ultimate regression.  Recently she also had noticed some vague palpitations which have improved with further titration of Toprol-XL to 50 mg daily.      I last evaluated her in a telemedicine visit on July 01, 2019.  Lipid studies in June 2020 by Dr. Kary Kos showed improvement with initiation of statin therapy and total cholesterol was 177, triglycerides 118, HDL  83, and LDL 70.  Over the past several months, she has continued to do well.  She denies any recent chest pain.  Her palpitations have stabilized.  She underwent repeat laboratory on September 30, 2019.  Lipid studies were further improved with total cholesterol 156, and LDL cholesterol was now 58.  She denies any myalgias or arthralgias.  She denies palpitations or chest pain.  She presents for  reevaluation.   Past Medical History:  Diagnosis Date  . Arrhythmia    Hx of Palp.    Past Surgical History:  Procedure Laterality Date  . DILATION AND CURETTAGE OF UTERUS    . DOBUTAMINE STRESS ECHO  05/22/2011   Normal treadmill,normal stress echo  . DOPPLER ECHOCARDIOGRAPHY  10/03/2011   EF = >55%,mild mitral regurg  . Event Monitor  06/24/2008   NSR,Sinus Tach  . MYOMECTOMY  2011  . TONSILLECTOMY     age 59    No Known Allergies  Current Outpatient Medications  Medication Sig Dispense Refill  . acetaminophen (TYLENOL) 325 MG tablet Take 650 mg by mouth as needed.    Marland Kitchen ibuprofen (ADVIL,MOTRIN) 200 MG tablet Take 200 mg by mouth as needed.    Marland Kitchen levonorgestrel (MIRENA) 20 MCG/24HR IUD 1 each by Intrauterine route once.    . metoprolol succinate (TOPROL-XL) 25 MG 24 hr tablet TAKE 1.5 TABLETS (37.5 MG TOTAL) BY MOUTH DAILY. (Patient taking differently: Take 50 mg by mouth daily. ) 90 tablet 1  . omeprazole (PRILOSEC) 20 MG capsule Take 1 capsule by mouth as needed.    . rosuvastatin (CRESTOR) 20 MG tablet Take 1 tablet (20 mg total) by mouth daily. 90 tablet 3   No current facility-administered medications for this visit.     Social History   Socioeconomic History  . Marital status: Married    Spouse name: Not on file  . Number of children: Not on file  . Years of education: Not on file  . Highest education level: Not on file  Occupational History  . Not on file  Social Needs  . Financial resource strain: Not on file  . Food insecurity    Worry: Not on file    Inability: Not on file  . Transportation needs    Medical: Not on file    Non-medical: Not on file  Tobacco Use  . Smoking status: Former Smoker    Years: 5.00    Types: Cigarettes    Quit date: 12/10/1996    Years since quitting: 22.8  . Smokeless tobacco: Never Used  Substance and Sexual Activity  . Alcohol use: Yes    Comment: occas  . Drug use: No  . Sexual activity: Yes    Partners: Male     Comment: intercourse age 45, less than 5 sexual partners,des neg  Lifestyle  . Physical activity    Days per week: Not on file    Minutes per session: Not on file  . Stress: Not on file  Relationships  . Social Herbalist on phone: Not on file    Gets together: Not on file    Attends religious service: Not on file    Active member of club or organization: Not on file    Attends meetings of clubs or organizations: Not on file    Relationship status: Not on file  . Intimate partner violence    Fear of current or ex partner: Not on file    Emotionally abused: Not on file  Physically abused: Not on file    Forced sexual activity: Not on file  Other Topics Concern  . Not on file  Social History Narrative  . Not on file   Socially she is married and has 2 children, ages 1 and 17. There is no tobacco use. She does drink occasional alcohol.  Family History  Problem Relation Age of Onset  . Heart disease Mother 73  . Colon cancer Mother   . Hypertension Mother   . Hypertension Maternal Grandmother   . Heart disease Maternal Grandmother   . Breast cancer Maternal Grandmother   . Hypertension Maternal Grandfather   . Diabetes Maternal Grandfather   . Heart disease Maternal Grandfather 48  . Diabetes Maternal Aunt   . Colon cancer Maternal Uncle   . Heart disease Maternal Uncle    ROS General: Negative; No fevers, chills, or night sweats;  HEENT: Positive for rare nosebleeds; No changes in vision or hearing, sinus congestion, difficulty swallowing Pulmonary: Negative; No cough, wheezing, shortness of breath, hemoptysis Cardiovascular: Negative; No chest pain, presyncope, syncope, palpitations GI: Positive for mild GERD; No nausea, vomiting, diarrhea, or abdominal pain GU: Negative; No dysuria, hematuria, or difficulty voiding Musculoskeletal: Negative; no myalgias, joint pain, or weakness Hematologic/Oncology: Negative; no easy bruising, bleeding Endocrine:  Negative; no heat/cold intolerance; no diabetes Neuro: Negative; no changes in balance, headaches Skin: Negative; No rashes or skin lesions Psychiatric: Negative; No behavioral problems, depression Sleep: Negative; No snoring, daytime sleepiness, hypersomnolence, bruxism, restless legs, hypnogognic hallucinations, no cataplexy Other comprehensive 14 point system review is negative.   PE BP 134/82   Pulse 65   Ht 5' 2"  (1.575 m)   Wt 120 lb 6.4 oz (54.6 kg)   SpO2 100%   BMI 22.02 kg/m    Repeat blood pressure today by me was 130/80  Wt Readings from Last 3 Encounters:  10/08/19 120 lb 6.4 oz (54.6 kg)  07/01/19 115 lb (52.2 kg)  05/15/19 116 lb 12.8 oz (53 kg)   General: Alert, oriented, no distress.  Skin: normal turgor, no rashes, warm and dry HEENT: Normocephalic, atraumatic. Pupils equal round and reactive to light; sclera anicteric; extraocular muscles intact;  Nose without nasal septal hypertrophy Mouth/Parynx benign; Mallinpatti scale 2 Neck: No JVD, no carotid bruits; normal carotid upstroke Lungs: clear to ausculatation and percussion; no wheezing or rales Chest wall: without tenderness to palpitation Heart: PMI not displaced, RRR, s1 s2 normal, 1/6 systolic murmur, no diastolic murmur, no rubs, intermittent click gallops, thrills, or heaves Abdomen: soft, nontender; no hepatosplenomehaly, BS+; abdominal aorta nontender and not dilated by palpation. Back: no CVA tenderness Pulses 2+ Musculoskeletal: full range of motion, normal strength, no joint deformities Extremities: no clubbing cyanosis or edema, Homan's sign negative  Neurologic: grossly nonfocal; Cranial nerves grossly wnl Psychologic: Normal mood and affect   ECG (independently read by me): NSR aT 65; NO ECTOPY; NORMAL INTERVALS  December 2019 ECG (independently read by me): Normal sinus rhythm at 63 bpm.  Mild RV conduction delay.  No ectopy.  Normal intervals.  October 2018 ECG (independently read by  me): Normal sinus rhythm at 64 bpm.  September 2017 ECG (independently read by me): Normal sinus rhythm at 70 bpm.  Mild RV conduction delay.  Normal intervals.  February 2016 ECG (independently read by me): Normal sinus rhythm at 65 bpm.  No ectopy.  Normal intervals.  Mild RV conduction delay.  February 2015 ECG (independently read by me): Normal sinus rhythm at 68 beats per  minute. Normal intervals. No ectopy  LABS: BMP Latest Ref Rng & Units 09/30/2019 07/06/2016 07/12/2010  Glucose 65 - 99 mg/dL 87 93 81  BUN 6 - 24 mg/dL 13 15 8   Creatinine 0.57 - 1.00 mg/dL 0.75 0.75 0.66  BUN/Creat Ratio 9 - 23 17 20  -  Sodium 134 - 144 mmol/L 138 140 137  Potassium 3.5 - 5.2 mmol/L 3.9 5.0 3.7  Chloride 96 - 106 mmol/L 102 102 110  CO2 20 - 29 mmol/L 25 23 21   Calcium 8.7 - 10.2 mg/dL 9.2 9.5 7.6(L)   Hepatic Function Latest Ref Rng & Units 09/30/2019 07/06/2016  Total Protein 6.0 - 8.5 g/dL 6.3 6.8  Albumin 3.8 - 4.8 g/dL 4.3 4.5  AST 0 - 40 IU/L 16 20  ALT 0 - 32 IU/L 11 12  Alk Phosphatase 39 - 117 IU/L 47 45  Total Bilirubin 0.0 - 1.2 mg/dL 0.4 0.4   CBC Latest Ref Rng & Units 07/06/2016 07/12/2010  WBC 3.4 - 10.8 x10E3/uL 6.3 8.6  Hemoglobin 11.1 - 15.9 g/dL 13.5 12.6  Hematocrit 34.0 - 46.6 % 38.9 36.6  Platelets 150 - 379 x10E3/uL 278 318   Lab Results  Component Value Date   MCV 91 07/06/2016   MCV 92.5 07/12/2010   Lab Results  Component Value Date   TSH 2.590 07/06/2016   No results found for: HGBA1C  Lipid Panel     Component Value Date/Time   CHOL 156 09/30/2019 0921   TRIG 102 09/30/2019 0921   HDL 80 09/30/2019 0921   CHOLHDL 2.0 09/30/2019 0921   LDLCALC 58 09/30/2019 0921    RADIOLOGY: No results found.  CARDIAC STUDIES  CT CARDIAC SCORING : November 12, 2018. ADDENDUM REPORT: 11/13/2018 13:15  CLINICAL DATA:  Risk stratification  EXAM: Coronary Calcium Score  TECHNIQUE: The patient was scanned on a Siemens Somatom 64 slice scanner. Axial  non-contrast 3 mm slices were carried out through the heart. The data set was analyzed on a dedicated work station and scored using the [place].  FINDINGS: Non-cardiac: See separate report from [facility].  Ascending aorta: Normal diameter 2.9 cm  Pericardium: Normal  Coronary arteries: Dense calcium noted in proximal and mid LAD  IMPRESSION: Coronary calcium score of 122. This was 17 th percentile for age and sex matched control.  Exercise tolerance test May 27, 2019:  Study Highlights   Blood pressure and heart rate demonstrated a normal response to exercise.  There was no ST segment deviation noted during stress.  Patient had good exercise capacity, exercising for 10 minutes  Study stopped for fatigue and atypical chest pain.    IMPRESSION:  1. Elevated coronary artery calcium score   2. Palpitations   3. Family history of ischemic heart disease   4. Hyperlipidemia with target LDL less than 70     ASSESSMENT AND PLAN: Natalie Hooper is a very pleasant 48 year old female who has documented normal systolic and diastolic function with flat mitral valve closure and mild mitral regurgitation by an echo Doppler study in 2012. She was not found to have definitive mitral valve prolapse. She has an intermittent click on exam and a systolic murmur suggestive of MR. she has continued to do well.  Due to her significant family history for CAD, a CT cardiac score was done which revealed an elevated calcium score at 122 which was 99th percentile for age and sex matched control.  Subsequent exercise treadmill testing demonstrated good exercise tolerance without  chest pain development or ECG abnormalities.  Based on her evidence for calcification, she has now been on rosuvastatin 20 mg.  She is tolerating this well.  Most recent lipid studies are excellent with an LDL cholesterol decreasing to 58 compared to 122 prior to therapy.  I discussed with her potential for  plaque regression as well as plaque stability with levels in this range.  She continues to exercise regularly and denies any exertional dyspnea.  Her palpitations are controlled with metoprolol.  She will undergo follow-up laboratory with Dr. Kary Kos in 6 months.  I will's asked that these be sent to me for my review.  I will see her in 1 year for reevaluation or sooner problems arise.   Troy Sine, MD, [facility]  10/08/2019 6:43 PM

## 2019-11-11 ENCOUNTER — Other Ambulatory Visit: Payer: Self-pay

## 2019-11-12 ENCOUNTER — Ambulatory Visit (INDEPENDENT_AMBULATORY_CARE_PROVIDER_SITE_OTHER): Payer: 59 | Admitting: Obstetrics & Gynecology

## 2019-11-12 ENCOUNTER — Encounter: Payer: Self-pay | Admitting: Obstetrics & Gynecology

## 2019-11-12 VITALS — BP 130/80 | Ht 62.0 in | Wt 118.0 lb

## 2019-11-12 DIAGNOSIS — Z30431 Encounter for routine checking of intrauterine contraceptive device: Secondary | ICD-10-CM | POA: Diagnosis not present

## 2019-11-12 DIAGNOSIS — Z01419 Encounter for gynecological examination (general) (routine) without abnormal findings: Secondary | ICD-10-CM | POA: Diagnosis not present

## 2019-11-12 NOTE — Progress Notes (Signed)
Natalie Hooper 1970/11/21 [id number]   History:    49 y.o. G2P2L2 Married.  Sons 66 and 56 yo.  RP:  Established patient presenting for annual gyn exam   HPI: Well on Mirena IUD since July 25, 2018.  No breakthrough bleeding.  No pelvic pain.  No pain with intercourse.  Urine and bowel movements normal.  Breasts normal.  Body mass index 21.58. Physically active.  Health labs all normal with family physician at [facility] done September 25, 2018.   Past medical history,surgical history, family history and social history were all reviewed and documented in the EPIC chart.  Gynecologic History No LMP recorded (lmp unknown). (Menstrual status: IUD).  Obstetric History OB History  Gravida Para Term Preterm AB Living  2       0 2  SAB TAB Ectopic Multiple Live Births      0        # Outcome Date GA Lbr Len/2nd Weight Sex Delivery Anes PTL Lv  2 Gravida           1 Gravida              ROS: A ROS was performed and pertinent positives and negatives are included in the history.  GENERAL: No fevers or chills. HEENT: No change in vision, no earache, sore throat or sinus congestion. NECK: No pain or stiffness. CARDIOVASCULAR: No chest pain or pressure. No palpitations. PULMONARY: No shortness of breath, cough or wheeze. GASTROINTESTINAL: No abdominal pain, nausea, vomiting or diarrhea, melena or bright red blood per rectum. GENITOURINARY: No urinary frequency, urgency, hesitancy or dysuria. MUSCULOSKELETAL: No joint or muscle pain, no back pain, no recent trauma. DERMATOLOGIC: No rash, no itching, no lesions. ENDOCRINE: No polyuria, polydipsia, no heat or cold intolerance. No recent change in weight. HEMATOLOGICAL: No anemia or easy bruising or bleeding. NEUROLOGIC: No headache, seizures, numbness, tingling or weakness. PSYCHIATRIC: No depression, no loss of interest in normal activity or change in sleep pattern.     Exam:   BP 130/80 (BP Location: Right Arm, Patient Position: Sitting, Cuff  Size: Normal)   Ht 5\' 2"  (1.575 m)   Wt 118 lb (53.5 kg)   LMP  (LMP Unknown) Comment: SPOTTING  BMI 21.58 kg/m   Body mass index is 21.58 kg/m.  General appearance : Well developed well nourished female. No acute distress HEENT: Eyes: no retinal hemorrhage or exudates,  Neck supple, trachea midline, no carotid bruits, no thyroidmegaly Lungs: Clear to auscultation, no rhonchi or wheezes, or rib retractions  Heart: Regular rate and rhythm, no murmurs or gallops Breast:Examined in sitting and supine position were symmetrical in appearance, no palpable masses or tenderness,  no skin retraction, no nipple inversion, no nipple discharge, no skin discoloration, no axillary or supraclavicular lymphadenopathy Abdomen: no palpable masses or tenderness, no rebound or guarding Extremities: no edema or skin discoloration or tenderness  Pelvic: Vulva: Normal             Vagina: No gross lesions or discharge  Cervix: No gross lesions or discharge.  IUD strings felt at [facility] of cervix.  Uterus  AV, normal size, shape and consistency, non-tender and mobile  Adnexa  Without masses or tenderness  Anus: Normal   Assessment/Plan:  49 y.o. female for annual exam   1. Well female exam with routine gynecological exam Normal gynecologic exam.  Pap test December 2019 was negative, no indication to repeat this year.  Breast exam normal.  Screening mammogram July 2020  was negative.  Will schedule a colonoscopy now.  Health labs with family physician.  Excellent body mass index at 21.58.  Continue with healthy nutrition and fitness.  2. Encounter for routine checking of intrauterine contraceptive device (IUD) Mirena IUD since September 2019, in good position and well-tolerated.  Princess Bruins MD, 2:14 PM 11/12/2019

## 2019-11-17 ENCOUNTER — Encounter: Payer: Self-pay | Admitting: Obstetrics & Gynecology

## 2019-11-17 NOTE — Patient Instructions (Signed)
1. Well female exam with routine gynecological exam Normal gynecologic exam.  Pap test December 2019 was negative, no indication to repeat this year.  Breast exam normal.  Screening mammogram July 2020 was negative.  Will schedule a colonoscopy now.  Health labs with family physician.  Excellent body mass index at 21.58.  Continue with healthy nutrition and fitness.  2. Encounter for routine checking of intrauterine contraceptive device (IUD) Mirena IUD since September 2019, in good position and well-tolerated.  Natalie Hooper, it was a pleasure seeing you today!

## 2019-12-11 ENCOUNTER — Other Ambulatory Visit: Payer: Self-pay | Admitting: Cardiovascular Disease

## 2020-01-10 ENCOUNTER — Other Ambulatory Visit: Payer: Self-pay | Admitting: Cardiovascular Disease

## 2020-01-21 ENCOUNTER — Other Ambulatory Visit: Payer: Self-pay

## 2020-01-21 ENCOUNTER — Ambulatory Visit: Payer: 59 | Admitting: Dermatology

## 2020-01-21 DIAGNOSIS — D2271 Melanocytic nevi of right lower limb, including hip: Secondary | ICD-10-CM

## 2020-01-21 DIAGNOSIS — D1801 Hemangioma of skin and subcutaneous tissue: Secondary | ICD-10-CM

## 2020-01-21 DIAGNOSIS — D225 Melanocytic nevi of trunk: Secondary | ICD-10-CM | POA: Diagnosis not present

## 2020-01-21 DIAGNOSIS — D229 Melanocytic nevi, unspecified: Secondary | ICD-10-CM

## 2020-01-21 DIAGNOSIS — L821 Other seborrheic keratosis: Secondary | ICD-10-CM | POA: Diagnosis not present

## 2020-01-21 NOTE — Progress Notes (Signed)
   Follow-Up Visit   Subjective  Natalie Hooper is a 49 y.o. female who presents for the following: Follow-up (nevi on left lower abd, right calf).  She has not noticed any changes of either mole. She does have some spots on her right leg present for awhile that are raised but asymptomatic.  The following portions of the chart were reviewed this encounter and updated as appropriate:     Review of Systems: No other skin or systemic complaints.  Objective  Well appearing patient in no apparent distress; mood and affect are within normal limits.  A focused examination was performed including right leg, abdomen, face. Relevant physical exam findings are noted in the Assessment and Plan.  Objective  Left Abdomen (side) - Lower, right upper calf (2): 0.3 cm thin symmetric dark brown papule at right upper calf  0.45 cm thin dark brown papule at left lower abd.  Objective  Right Lower Leg - Anterior: Stuck-on, waxy, tan-brown papules and plaques -- Discussed benign etiology and prognosis.   Assessment & Plan  Hemangioma of skin Left Abdomen (side) - Lower  Benign, observe.    Nevus (2) Left Abdomen (side) - Lower, right upper calf  Seborrheic keratosis Right Lower Leg - Anterior  Benign, observe.     Return in about 9 months (around 10/22/2020) for TBSE.   I, Donzetta Kohut, CMA, am acting as scribe for Forest Gleason, MD .  Documentation: I have reviewed the above documentation for accuracy and completeness, and I agree with the above.  Forest Gleason, MD

## 2020-01-21 NOTE — Patient Instructions (Addendum)
The nature of sun-induced photo-aging and skin cancers is discussed.  Sun avoidance, protective clothing, and the use of 30-SPF or higher sunscreens is advised. Observe closely for skin damage/changes, and call if such occurs.  Recommend [employer]

## 2020-01-22 ENCOUNTER — Ambulatory Visit: Payer: 59

## 2020-01-26 ENCOUNTER — Ambulatory Visit: Payer: 59 | Attending: Internal Medicine

## 2020-01-26 DIAGNOSIS — Z23 Encounter for immunization: Secondary | ICD-10-CM

## 2020-01-26 NOTE — Progress Notes (Signed)
   Covid-19 Vaccination Clinic  Name:  Natalie Hooper    MRN: [id number] DOB: 11/23/1970  01/26/2020  [name] was observed post Covid-19 immunization for 15 minutes without incident. She was provided with Vaccine Information Sheet and instruction to access the V-Safe system.   [name] was instructed to call 911 with any severe reactions post vaccine: Marland Kitchen Difficulty breathing  . Swelling of face and throat  . A fast heartbeat  . A bad rash all over body  . Dizziness and weakness   Immunizations Administered    Name Date Dose VIS Date Route   Pfizer COVID-19 Vaccine 01/26/2020 10:48 AM 0.3 mL 10/17/2019 Intramuscular   Manufacturer: [place]   Lot: [id number]   [place]: [id number]

## 2020-02-18 ENCOUNTER — Ambulatory Visit: Payer: 59 | Attending: Internal Medicine

## 2020-02-18 DIAGNOSIS — Z23 Encounter for immunization: Secondary | ICD-10-CM

## 2020-02-18 NOTE — Progress Notes (Signed)
   Covid-19 Vaccination Clinic  Name:  Natalie Hooper    MRN: [id number] DOB: [birth year]  02/18/2020  [name] was observed post Covid-19 immunization for 15 minutes without incident. She was provided with Vaccine Information Sheet and instruction to access the V-Safe system.   [name] was instructed to call 911 with any severe reactions post vaccine: Marland Kitchen Difficulty breathing  . Swelling of face and throat  . A fast heartbeat  . A bad rash all over body  . Dizziness and weakness   Immunizations Administered    Name Date Dose VIS Date Route   Pfizer COVID-19 Vaccine 02/18/2020 11:39 AM 0.3 mL 10/17/2019 Intramuscular   Manufacturer: [place]   Lot: [id number]   [place]: [id number]

## 2020-03-15 ENCOUNTER — Other Ambulatory Visit
Admission: RE | Admit: 2020-03-15 | Discharge: 2020-03-15 | Disposition: A | Discharge status: Home / Self Care | Payer: 59 | Source: Ambulatory Visit | Attending: Internal Medicine | Admitting: Internal Medicine

## 2020-03-15 ENCOUNTER — Other Ambulatory Visit: Payer: Self-pay

## 2020-03-15 DIAGNOSIS — Z01812 Encounter for preprocedural laboratory examination: Secondary | ICD-10-CM | POA: Insufficient documentation

## 2020-03-15 DIAGNOSIS — Z20822 Contact with and (suspected) exposure to covid-19: Secondary | ICD-10-CM | POA: Insufficient documentation

## 2020-03-15 LAB — SARS CORONAVIRUS 2 (TAT 6-24 HRS): SARS Coronavirus 2: NEGATIVE

## 2020-03-16 ENCOUNTER — Other Ambulatory Visit: Payer: 59

## 2020-03-17 ENCOUNTER — Encounter: Payer: Self-pay | Admitting: Internal Medicine

## 2020-03-18 ENCOUNTER — Ambulatory Visit
Admission: RE | Admit: 2020-03-18 | Discharge: 2020-03-18 | Disposition: A | Discharge status: Home / Self Care | Payer: 59 | Attending: Internal Medicine | Admitting: Internal Medicine

## 2020-03-18 ENCOUNTER — Encounter
Admission: RE | Disposition: A | Discharge status: Home / Self Care | Payer: Self-pay | Source: Home / Self Care | Attending: Internal Medicine

## 2020-03-18 ENCOUNTER — Ambulatory Visit: Payer: 59 | Admitting: Anesthesiology

## 2020-03-18 ENCOUNTER — Other Ambulatory Visit: Payer: Self-pay

## 2020-03-18 ENCOUNTER — Encounter: Payer: Self-pay | Admitting: Internal Medicine

## 2020-03-18 DIAGNOSIS — Z1211 Encounter for screening for malignant neoplasm of colon: Secondary | ICD-10-CM | POA: Diagnosis not present

## 2020-03-18 DIAGNOSIS — Z87891 Personal history of nicotine dependence: Secondary | ICD-10-CM | POA: Diagnosis not present

## 2020-03-18 DIAGNOSIS — Z79899 Other long term (current) drug therapy: Secondary | ICD-10-CM | POA: Insufficient documentation

## 2020-03-18 DIAGNOSIS — K2100 Gastro-esophageal reflux disease with esophagitis, without bleeding: Secondary | ICD-10-CM | POA: Diagnosis not present

## 2020-03-18 DIAGNOSIS — K640 First degree hemorrhoids: Secondary | ICD-10-CM | POA: Diagnosis not present

## 2020-03-18 DIAGNOSIS — I10 Essential (primary) hypertension: Secondary | ICD-10-CM | POA: Diagnosis not present

## 2020-03-18 DIAGNOSIS — K219 Gastro-esophageal reflux disease without esophagitis: Secondary | ICD-10-CM | POA: Diagnosis present

## 2020-03-18 HISTORY — DX: Nonrheumatic mitral (valve) insufficiency: I34.0

## 2020-03-18 HISTORY — DX: Essential (primary) hypertension: I10

## 2020-03-18 HISTORY — PX: COLONOSCOPY WITH PROPOFOL: SHX5780

## 2020-03-18 HISTORY — PX: ESOPHAGOGASTRODUODENOSCOPY (EGD) WITH PROPOFOL: SHX5813

## 2020-03-18 HISTORY — DX: Unilateral inguinal hernia, without obstruction or gangrene, not specified as recurrent: K40.90

## 2020-03-18 HISTORY — DX: Palpitations: R00.2

## 2020-03-18 LAB — POCT PREGNANCY, URINE: Preg Test, Ur: NEGATIVE

## 2020-03-18 SURGERY — COLONOSCOPY WITH PROPOFOL
Anesthesia: General

## 2020-03-18 MED ORDER — LIDOCAINE HCL (CARDIAC) PF 100 MG/5ML IV SOSY
PREFILLED_SYRINGE | INTRAVENOUS | Status: DC | PRN
  Administered 2020-03-18: 50 mg via INTRAVENOUS

## 2020-03-18 MED ORDER — MIDAZOLAM HCL 2 MG/2ML IJ SOLN
INTRAMUSCULAR | Status: AC
  Filled 2020-03-18: qty 2

## 2020-03-18 MED ORDER — EPHEDRINE SULFATE 50 MG/ML IJ SOLN
INTRAMUSCULAR | Status: DC | PRN
  Administered 2020-03-18: 5 mg via INTRAVENOUS

## 2020-03-18 MED ORDER — LIDOCAINE HCL (PF) 2 % IJ SOLN
INTRAMUSCULAR | Status: AC
  Filled 2020-03-18: qty 5

## 2020-03-18 MED ORDER — PROPOFOL 500 MG/50ML IV EMUL
INTRAVENOUS | Status: AC
  Filled 2020-03-18: qty 50

## 2020-03-18 MED ORDER — FENTANYL CITRATE (PF) 100 MCG/2ML IJ SOLN
INTRAMUSCULAR | Status: AC
  Filled 2020-03-18: qty 2

## 2020-03-18 MED ORDER — SODIUM CHLORIDE 0.9 % IV SOLN: INTRAVENOUS | Status: DC

## 2020-03-18 MED ORDER — PROPOFOL 500 MG/50ML IV EMUL
INTRAVENOUS | Status: DC | PRN
  Administered 2020-03-18: 120 ug/kg/min via INTRAVENOUS

## 2020-03-18 MED ORDER — FENTANYL CITRATE (PF) 100 MCG/2ML IJ SOLN
INTRAMUSCULAR | Status: DC | PRN
  Administered 2020-03-18: 50 ug via INTRAVENOUS

## 2020-03-18 MED ORDER — MIDAZOLAM HCL 2 MG/2ML IJ SOLN
INTRAMUSCULAR | Status: DC | PRN
  Administered 2020-03-18: 2 mg via INTRAVENOUS

## 2020-03-18 NOTE — Interval H&P Note (Signed)
History and Physical Interval Note:  03/18/2020 10:06 AM  Natalie Hooper  has presented today for surgery, with the diagnosis of CC SCREEN GERD.  The various methods of treatment have been discussed with the patient and family. After consideration of risks, benefits and other options for treatment, the patient has consented to  Procedure(s): COLONOSCOPY WITH PROPOFOL (N/A) ESOPHAGOGASTRODUODENOSCOPY (EGD) WITH PROPOFOL (N/A) as a surgical intervention.  The patient's history has been reviewed, patient examined, no change in status, stable for surgery.  I have reviewed the patient's chart and labs.  Questions were answered to the patient's satisfaction.     [place], [place]

## 2020-03-18 NOTE — Anesthesia Postprocedure Evaluation (Signed)
Anesthesia Post Note  Patient: Natalie Hooper  Procedure(s) Performed: COLONOSCOPY WITH PROPOFOL (N/A ) ESOPHAGOGASTRODUODENOSCOPY (EGD) WITH PROPOFOL (N/A )  Patient location during evaluation: Endoscopy Anesthesia Type: General Level of consciousness: awake and alert Pain management: pain level controlled Vital Signs Assessment: post-procedure vital signs reviewed and stable Respiratory status: spontaneous breathing, nonlabored ventilation and respiratory function stable Cardiovascular status: blood pressure returned to baseline and stable Postop Assessment: no apparent nausea or vomiting Anesthetic complications: no     Last Vitals:  Vitals:   03/18/20 1053 03/18/20 1113  BP: 124/81   Pulse:    Resp:  14  Temp:    SpO2: 99% 100%    Last Pain:  Vitals:   03/18/20 1113  TempSrc:   PainSc: 0-No pain                 Alphonsus Sias

## 2020-03-18 NOTE — H&P (Signed)
Outpatient short stay form Pre-procedure 03/18/2020 10:04 AM Natalie Hooper K. Alice Reichert, M.D.  Primary Physician: [place] Pink, M.D.  Reason for visit:  GERD, colon cancer screening.  History of present illness:  49 y/o patient presents with uncomplicated GERD for Barrett's screening. Patient denies intractable heartburn, dysphagia, hemetemesis, abdominal pain, nausea or vomiting.  Patient presents for colonoscopy for colon cancer screening. The patient denies complaints of abdominal pain, significant change in bowel habits, or rectal bleeding.      Current Facility-Administered Medications:  .  0.9 %  sodium chloride infusion, , Intravenous, Continuous, Michie, Benay Pike, MD, Last Rate: 20 mL/hr at 03/18/20 0941, New Bag at 03/18/20 0941  Medications Prior to Admission  Medication Sig Dispense Refill Last Dose  . ibuprofen (ADVIL,MOTRIN) 200 MG tablet Take 200 mg by mouth as needed.   03/17/2020 at 2100  . levonorgestrel (MIRENA) 20 MCG/24HR IUD 1 each by Intrauterine route once.   Past Month at Unknown time  . metoprolol succinate (TOPROL-XL) 25 MG 24 hr tablet TAKE 1.5 TABLETS (37.5 MG TOTAL) BY MOUTH DAILY. 135 tablet 2 03/17/2020 at 2030  . omeprazole (PRILOSEC) 20 MG capsule Take 1 capsule by mouth as needed.   Past Week at Unknown time  . rosuvastatin (CRESTOR) 20 MG tablet TAKE 1 TABLET (20 MG TOTAL) BY MOUTH DAILY. 90 tablet 3 03/17/2020 at 2030  . acetaminophen (TYLENOL) 325 MG tablet Take 650 mg by mouth as needed.        No Known Allergies   Past Medical History:  Diagnosis Date  . Arrhythmia    Hx of Palp.  Marland Kitchen Hypertension   . Mitral regurgitation   . Palpitations   . Unilateral inguinal hernia     Review of systems:  Otherwise negative.    Physical Exam  Gen: Alert, oriented. Appears stated age.  HEENT: [place]/AT. PERRLA. Lungs: CTA, no wheezes. CV: RR nl S1, S2. Abd: soft, benign, no masses. BS+ Ext: No edema. Pulses 2+    Planned procedures: Proceed with EGD and  colonoscopy. The patient understands the nature of the planned procedure, indications, risks, alternatives and potential complications including but not limited to bleeding, infection, perforation, damage to internal organs and possible oversedation/side effects from anesthesia. The patient agrees and gives consent to proceed.  Please refer to procedure notes for findings, recommendations and patient disposition/instructions.     Joetta Delprado K. Alice Reichert, M.D. Gastroenterology 03/18/2020  10:04 AM

## 2020-03-18 NOTE — Transfer of Care (Signed)
Immediate Anesthesia Transfer of Care Note  Patient: Natalie Hooper  Procedure(s) Performed: COLONOSCOPY WITH PROPOFOL (N/A ) ESOPHAGOGASTRODUODENOSCOPY (EGD) WITH PROPOFOL (N/A )  Patient Location: PACU  Anesthesia Type:General  Level of Consciousness: awake and sedated  Airway & Oxygen Therapy: Patient Spontanous Breathing and Patient connected to nasal cannula oxygen  Post-op Assessment: Report given to RN and Post -op Vital signs reviewed and stable  Post vital signs: Reviewed and stable  Last Vitals:  Vitals Value Taken Time  BP    Temp    Pulse    Resp    SpO2      Last Pain:  Vitals:   03/18/20 0918  PainSc: 0-No pain         Complications: No apparent anesthesia complications

## 2020-03-18 NOTE — Op Note (Addendum)
[facility] Patient Name: Natalie Hooper Procedure Date: 03/18/2020 10:10 AM MRN: [id number] Account #: [id number] Date of Birth: Jan 16, 1971 Admit Type: Outpatient Age: 49 Room: [facility] ENDO ROOM 4 Gender: Female Note Status: Finalized Procedure:             Colonoscopy Indications:           Screening for colorectal malignant neoplasm Providers:             Benay Pike. Alice Reichert MD, MD Referring MD:          Irven Easterly. Kary Kos, MD (Referring MD) Medicines:             Propofol per Anesthesia Complications:         No immediate complications. Procedure:             Pre-Anesthesia Assessment:                        - The risks and benefits of the procedure and the                         sedation options and risks were discussed with the                         patient. All questions were answered and informed                         consent was obtained.                        - Patient identification and proposed procedure were                         verified prior to the procedure by the nurse. The                         procedure was verified in the procedure room.                        - ASA Grade Assessment: III - A patient with severe                         systemic disease.                        - After reviewing the risks and benefits, the patient                         was deemed in satisfactory condition to undergo the                         procedure.                        After obtaining informed consent, the colonoscope was                         passed under direct vision. Throughout the procedure,                         the patient's blood pressure,  pulse, and oxygen                         saturations were monitored continuously. The                         Colonoscope was introduced through the anus and                         advanced to the the cecum, identified by appendiceal                         orifice and ileocecal valve.  The colonoscopy was                         performed without difficulty. The patient tolerated                         the procedure well. The quality of the bowel                         preparation was excellent. The ileocecal valve,                         appendiceal orifice, and rectum were photographed. Findings:      The perianal and digital rectal examinations were normal. Pertinent       negatives include normal sphincter tone and no palpable rectal lesions.      The colon (entire examined portion) appeared normal.      Non-bleeding internal hemorrhoids were found during retroflexion. The       hemorrhoids were Grade I (internal hemorrhoids that do not prolapse). Impression:            - The entire examined colon is normal.                        - Non-bleeding internal hemorrhoids.                        - No specimens collected. Recommendation:        - Await pathology results from EGD, also performed                         today.                        - Patient has a contact number available for                         emergencies. The signs and symptoms of potential                         delayed complications were discussed with the patient.                         Return to normal activities tomorrow. Written                         discharge instructions were provided to the patient.                        -  Resume previous diet.                        - Continue present medications.                        - Repeat colonoscopy in 10 years for screening                         purposes.                        - Return to GI office PRN.                        - The findings and recommendations were discussed with                         the patient. Procedure Code(s):     --- Professional ---                        [id number], Colorectal cancer screening; colonoscopy on                         individual not meeting criteria for high risk Diagnosis Code(s):     ---  Professional ---                        K64.0, First degree hemorrhoids                        Z12.11, Encounter for screening for malignant neoplasm                         of colon CPT copyright 2019 American [employer]. All rights reserved. The codes documented in this report are preliminary and upon coder review may  be revised to meet current compliance requirements. Efrain Sella MD, MD 03/18/2020 10:41:10 AM This report has been signed electronically. Number of Addenda: 0 Note Initiated On: 03/18/2020 10:10 AM Scope Withdrawal Time: 0 hours 4 minutes 45 seconds  Total Procedure Duration: 0 hours 9 minutes 40 seconds  Estimated Blood Loss:  Estimated blood loss: none.      [facility]

## 2020-03-18 NOTE — Anesthesia Preprocedure Evaluation (Signed)
Anesthesia Evaluation  Patient identified by MRN, date of birth, ID band Patient awake    Reviewed: Allergy & Precautions, H&P , NPO status , Patient's Chart, lab work & pertinent test results, reviewed documented beta blocker date and time   History of Anesthesia Complications Negative for: history of anesthetic complications  Airway Mallampati: I  TM Distance: >3 FB Neck ROM: full    Dental  (+) Dental Advidsory Given, Caps, Teeth Intact   Pulmonary neg pulmonary ROS, former smoker,    Pulmonary exam normal breath sounds clear to auscultation       Cardiovascular Exercise Tolerance: Good hypertension, (-) angina+ CAD  (-) Past MI and (-) Cardiac Stents Normal cardiovascular exam+ dysrhythmias (palpitations) + Valvular Problems/Murmurs  Rhythm:regular Rate:Normal     Neuro/Psych negative neurological ROS  negative psych ROS   GI/Hepatic Neg liver ROS, GERD  ,  Endo/Other  negative endocrine ROS  Renal/GU negative Renal ROS  negative genitourinary   Musculoskeletal   Abdominal   Peds  Hematology negative hematology ROS (+)   Anesthesia Other Findings Past Medical History: No date: Arrhythmia     Comment:  Hx of Palp. No date: Hypertension No date: Mitral regurgitation No date: Palpitations No date: Unilateral inguinal hernia   Reproductive/Obstetrics negative OB ROS                             Anesthesia Physical Anesthesia Plan  ASA: II  Anesthesia Plan: General   Post-op Pain Management:    Induction: Intravenous  PONV Risk Score and Plan: 3 and Propofol infusion and TIVA  Airway Management Planned: Natural Airway and Nasal Cannula  Additional Equipment:   Intra-op Plan:   Post-operative Plan:   Informed Consent: I have reviewed the patients History and Physical, chart, labs and discussed the procedure including the risks, benefits and alternatives for the proposed  anesthesia with the patient or authorized representative who has indicated his/her understanding and acceptance.     Dental Advisory Given  Plan Discussed with: Anesthesiologist, CRNA and Surgeon  Anesthesia Plan Comments:         Anesthesia Quick Evaluation

## 2020-03-18 NOTE — Op Note (Addendum)
[facility] Patient Name: Natalie Hooper Procedure Date: 03/18/2020 10:11 AM MRN: [id number] Account #: [id number] Date of Birth: Aug 02, 1971 Admit Type: Outpatient Age: 49 Room: [facility] ENDO ROOM 4 Gender: Female Note Status: Finalized Procedure:             Upper GI endoscopy Indications:           Gastro-esophageal reflux disease Providers:             Benay Pike. Alice Reichert MD, MD Referring MD:          Irven Easterly. Kary Kos, MD (Referring MD) Medicines:             Propofol per Anesthesia Complications:         No immediate complications. Procedure:             Pre-Anesthesia Assessment:                        - The risks and benefits of the procedure and the                         sedation options and risks were discussed with the                         patient. All questions were answered and informed                         consent was obtained.                        - Patient identification and proposed procedure were                         verified prior to the procedure by the nurse. The                         procedure was verified in the procedure room.                        - ASA Grade Assessment: III - A patient with severe                         systemic disease.                        - After reviewing the risks and benefits, the patient                         was deemed in satisfactory condition to undergo the                         procedure.                        After obtaining informed consent, the endoscope was                         passed under direct vision. Throughout the procedure,                         the patient's blood pressure,  pulse, and oxygen                         saturations were monitored continuously. The Endoscope                         was introduced through the mouth, and advanced to the                         third part of duodenum. The upper GI endoscopy was                         accomplished without  difficulty. The patient tolerated                         the procedure well. Findings:      The Z-line was irregular and was found at the gastroesophageal junction.       Mucosa was biopsied with a cold forceps for histology. One specimen       bottle was sent to pathology.      The stomach was normal.      The examined duodenum was normal.      The exam was otherwise without abnormality. Impression:            - Z-line irregular, at the gastroesophageal junction.                         Biopsied.                        - Normal stomach.                        - Normal examined duodenum.                        - The examination was otherwise normal. Recommendation:        - Await pathology results.                        - Proceed with colonoscopy Procedure Code(s):     --- Professional ---                        [phone], Esophagogastroduodenoscopy, flexible,                         transoral; with biopsy, single or multiple Diagnosis Code(s):     --- Professional ---                        K21.9, Gastro-esophageal reflux disease without                         esophagitis                        K22.8, Other specified diseases of esophagus CPT copyright 2019 American Medical Association. All rights reserved. The codes documented in this report are preliminary and upon coder review may  be revised to meet current compliance requirements. Efrain Sella MD, MD 03/18/2020 10:27:41 AM This report has been signed electronically. Number of Addenda:  0 Note Initiated On: 03/18/2020 10:11 AM Estimated Blood Loss:  Estimated blood loss: none.      [facility]

## 2020-03-19 ENCOUNTER — Encounter: Payer: Self-pay | Admitting: *Deleted

## 2020-03-19 LAB — SURGICAL PATHOLOGY

## 2020-06-24 ENCOUNTER — Other Ambulatory Visit: Payer: Self-pay | Admitting: Obstetrics & Gynecology

## 2020-06-24 DIAGNOSIS — Z1231 Encounter for screening mammogram for malignant neoplasm of breast: Secondary | ICD-10-CM

## 2020-07-02 IMAGING — CT CT HEART SCORING
2 series · 16 of 20 positions shown, 18 images · non-contrast
Comparison: None.

Addendum:
EXAM:
OVER-READ INTERPRETATION  CT CHEST

The following report is an over-read performed by radiologist Dr.
Van-Dunem Bussuco [REDACTED] on 11/12/2018. This
over-read does not include interpretation of cardiac or coronary
anatomy or pathology. The coronary calcium score/coronary CTA
interpretation by the cardiologist is attached.
CLINICAL DATA: Risk stratification
Coronary Calcium Score
TECHNIQUE: The patient was scanned on a Siemens Somatom 64 slice scanner. Axial
non-contrast 3 mm slices were carried out through the heart. The
data set was analyzed on a dedicated work station and scored using
the Agatson method.

[Series 3: casc 3.0 i36f 2 bestdiast 70 % · axial · 0.30mm/px · z∈[-230,-128]mm · 8 of 44 slices shown, 10 images]
[im 5/44  vessel]
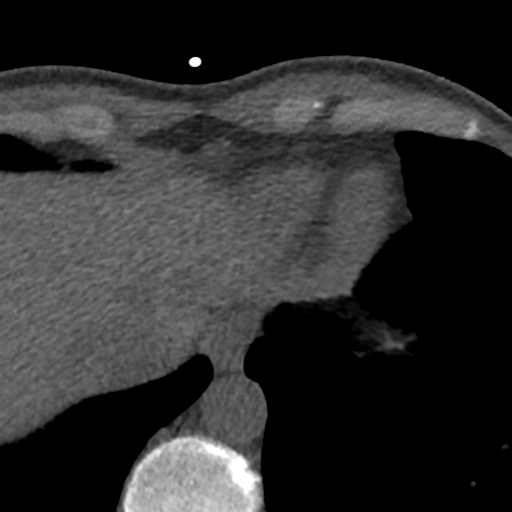
[im 5/44  lung]
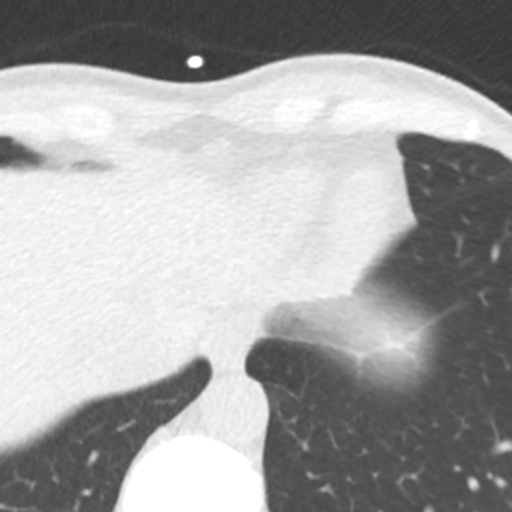
[im 10/44  vessel]
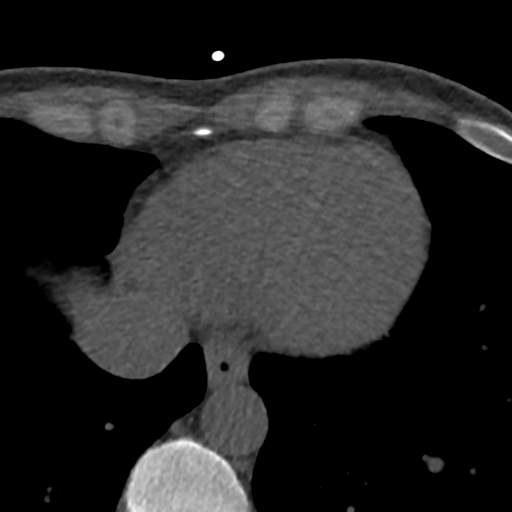
[im 15/44  vessel]
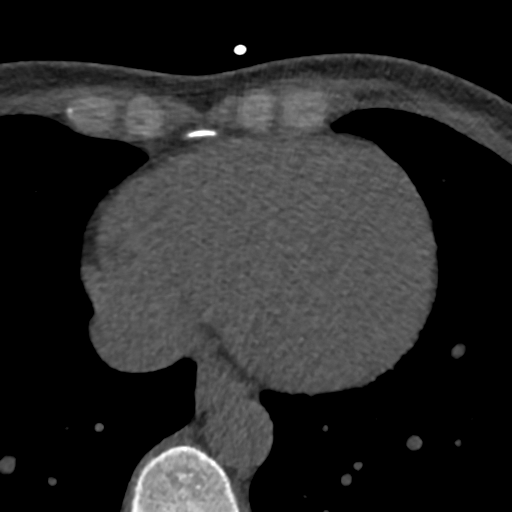
[im 20/44  vessel]
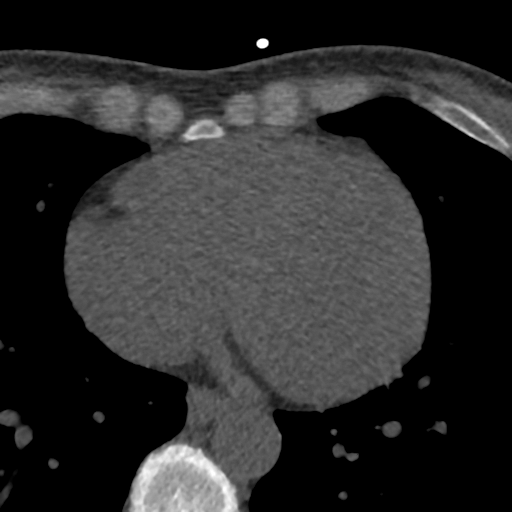
[im 24/44  vessel]
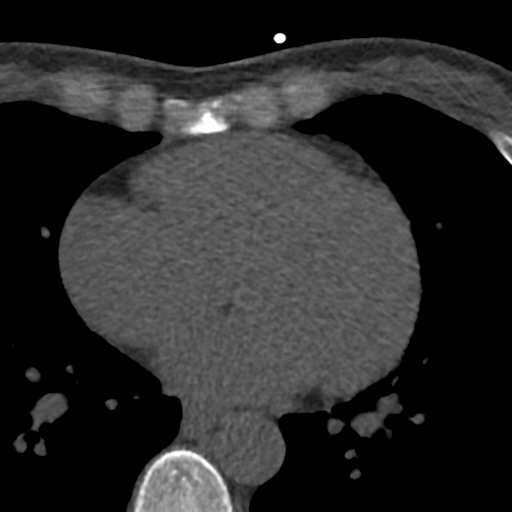
[im 24/44  lung]
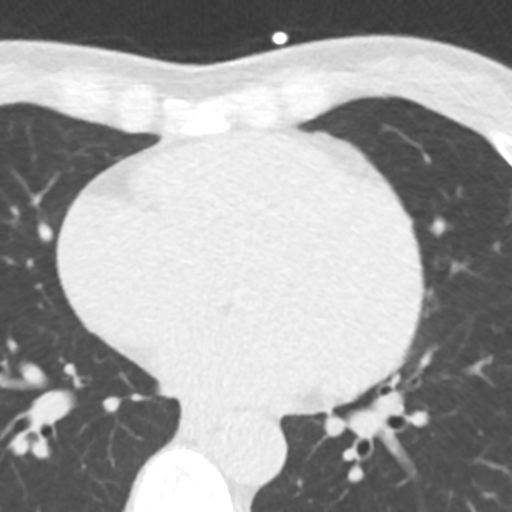
[im 29/44  vessel]
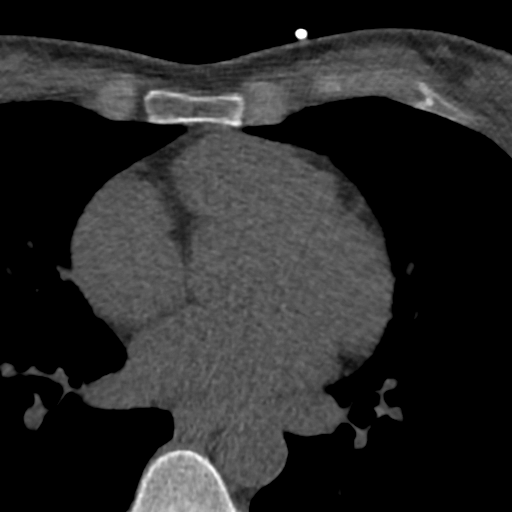
[im 34/44  vessel]
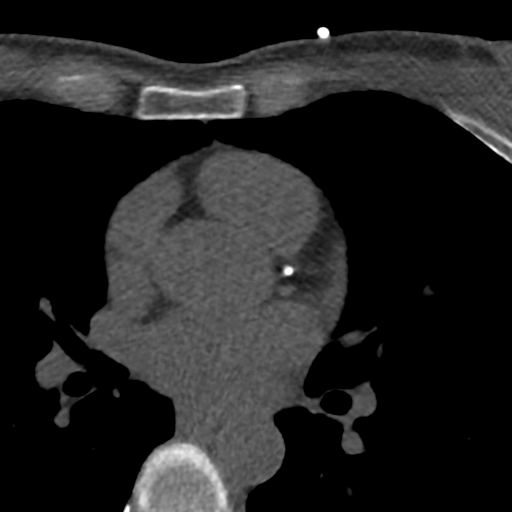
[im 39/44  vessel]
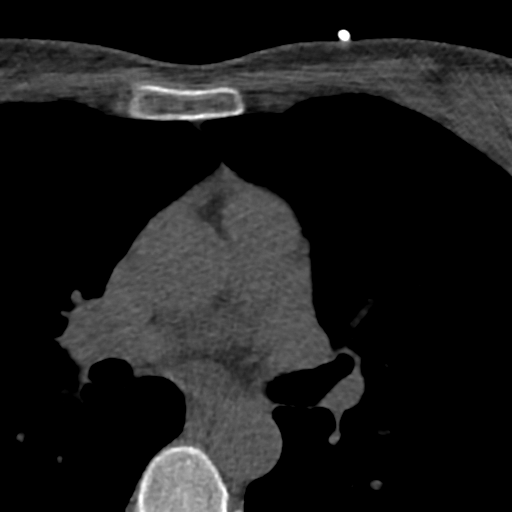

[Series 5: lung st 71 % · axial · 0.63mm/px · z∈[-230,-128]mm · 8 of 44 slices shown]
[im 5/44  lung]
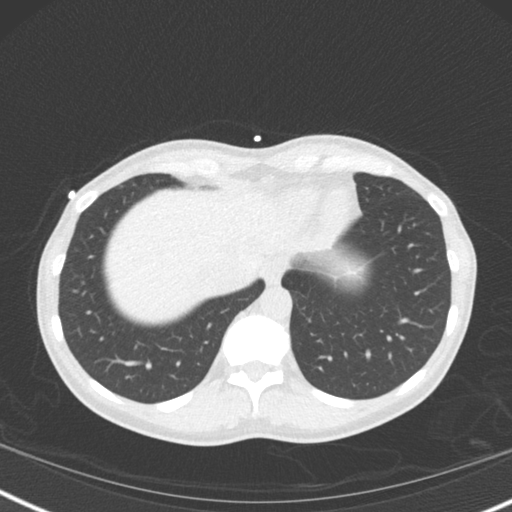
[im 10/44  lung]
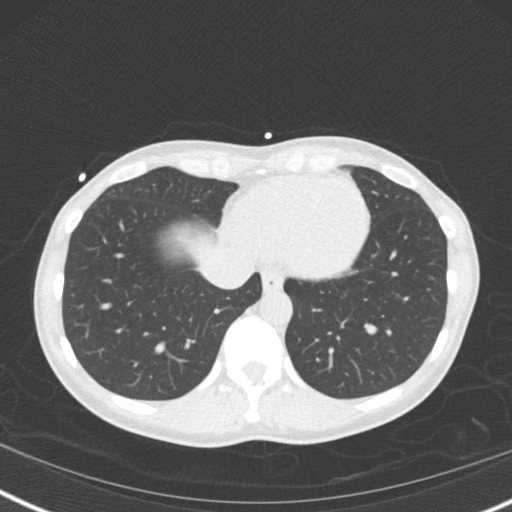
[im 15/44  lung]
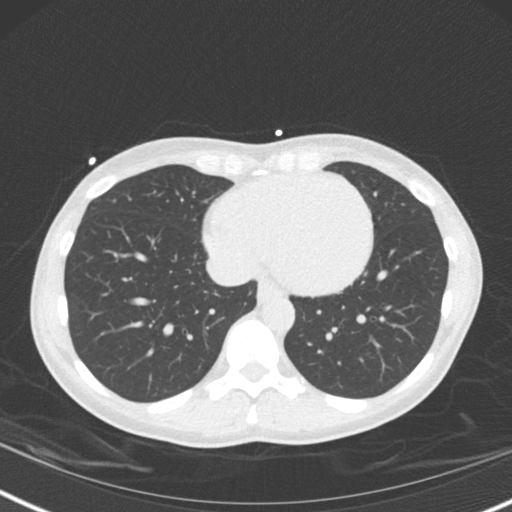
[im 20/44  lung]
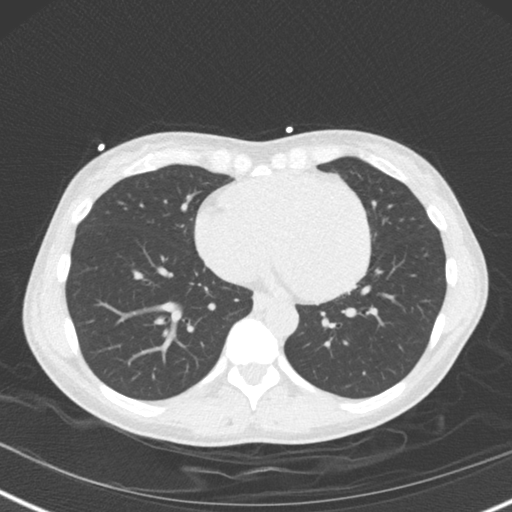
[im 24/44  lung]
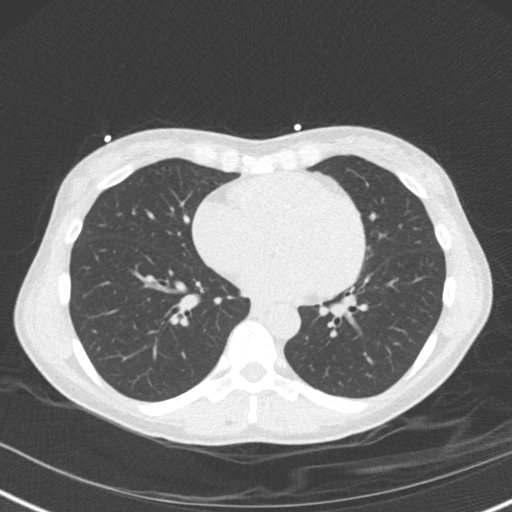
[im 29/44  lung]
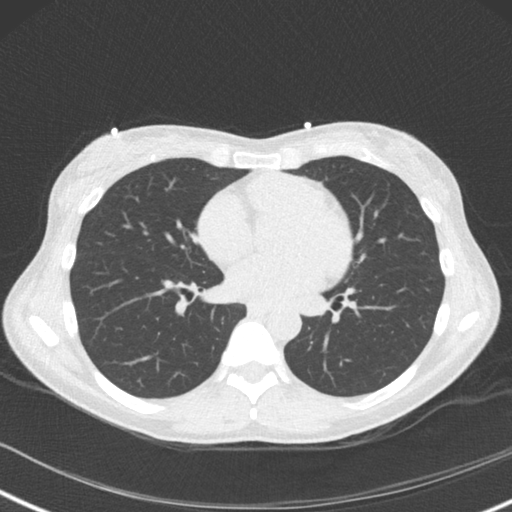
[im 34/44  lung]
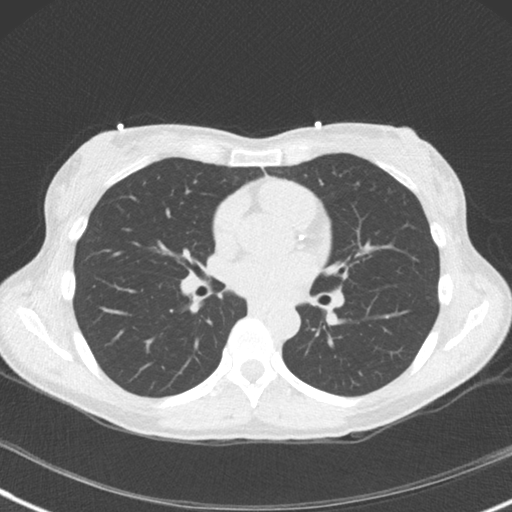
[im 39/44  lung]
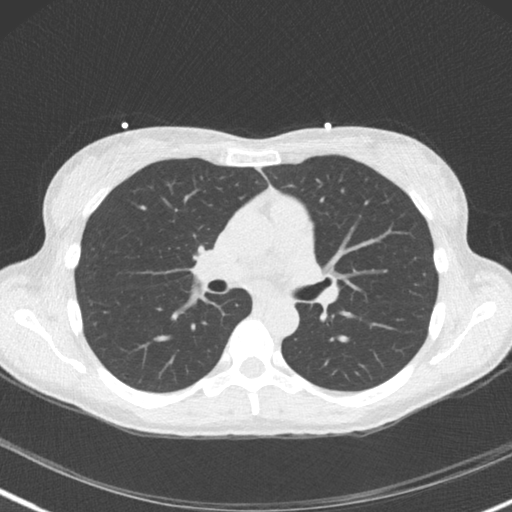

[16 of 20 positions shown; findings below may reference images not displayed]

FINDINGS: Limited view of the lung parenchyma demonstrates no suspicious
nodularity. Airways are normal.

Limited view of the mediastinum demonstrates no adenopathy.
Esophagus normal.

Limited view of the upper abdomen unremarkable.

Limited view of the skeleton and chest wall is unremarkable.
IMPRESSION: No significant extracardiac findings.
FINDINGS: Non-cardiac: See separate report from [REDACTED].

Ascending aorta: Normal diameter 2.9 cm

Pericardium: Normal

Coronary arteries: Dense calcium noted in proximal and mid LAD
IMPRESSION: Coronary calcium score of 122. This was 99 th percentile for age and
sex matched control.

[place]

*** End of Addendum ***

## 2020-07-06 ENCOUNTER — Ambulatory Visit: Payer: 59

## 2020-07-19 ENCOUNTER — Other Ambulatory Visit: Payer: Self-pay

## 2020-07-19 ENCOUNTER — Ambulatory Visit
Admission: RE | Admit: 2020-07-19 | Discharge: 2020-07-19 | Disposition: A | Discharge status: Home / Self Care | Payer: 59 | Source: Ambulatory Visit | Attending: Obstetrics & Gynecology | Admitting: Obstetrics & Gynecology

## 2020-07-19 DIAGNOSIS — Z1231 Encounter for screening mammogram for malignant neoplasm of breast: Secondary | ICD-10-CM

## 2020-10-07 ENCOUNTER — Other Ambulatory Visit: Payer: Self-pay

## 2020-10-07 ENCOUNTER — Ambulatory Visit (INDEPENDENT_AMBULATORY_CARE_PROVIDER_SITE_OTHER): Payer: 59 | Admitting: Cardiovascular Disease

## 2020-10-07 ENCOUNTER — Encounter: Payer: Self-pay | Admitting: Cardiovascular Disease

## 2020-10-07 VITALS — BP 120/78 | HR 69 | Ht 62.0 in | Wt 118.0 lb

## 2020-10-07 DIAGNOSIS — R931 Abnormal findings on diagnostic imaging of heart and coronary circulation: Secondary | ICD-10-CM

## 2020-10-07 DIAGNOSIS — E785 Hyperlipidemia, unspecified: Secondary | ICD-10-CM

## 2020-10-07 DIAGNOSIS — R002 Palpitations: Secondary | ICD-10-CM | POA: Diagnosis not present

## 2020-10-07 DIAGNOSIS — R011 Cardiac murmur, unspecified: Secondary | ICD-10-CM | POA: Diagnosis not present

## 2020-10-07 MED ORDER — METOPROLOL SUCCINATE ER 25 MG PO TB24: 37.5000 mg | ORAL_TABLET | Freq: Every day | ORAL | 3 refills | Status: DC

## 2020-10-07 NOTE — Progress Notes (Signed)
Patient ID: Natalie Hooper, female   DOB: 08-25-1971, 49 y.o.   MRN: [id number]      Primary M.D.: Dr. Chrystine Hooper  HPI: Natalie Hooper is a 49 y.o. female who presents for a 12 month follow-up cardiology evaluation. She is the daughter of my patient Ms. Natalie Hooper.  Natalie Hooper has a history of palpitations.  An echo Doppler study November 2012 showed normal systolic and diastolic function. She had flat mitral valve coaptation without definitive prolapse and mild mitral regurgitation with trivial tricuspid regurgitation. She had normal pulmonary pressures.  She has been on metoprolol succinate at 37.5 mg daily which has essentially controlled her palpitations. There is a very rare instance where she does note an occasional palpitation and she has taken an extra half of this metoprolol.  This usually occurs when she is resting under periods of increased stress.    I last saw her in October 2018.  At that time, she was doing well.  Palpitations were controlled with metoprolol.  She was sizing daily and remained active.    When I saw her in December 2019 she remained active and was often walking several miles and exercising daily.  She denied chest tightness or pressure.  She often walks several miles and exercises daily.  She denies any episodes of chest tightness or pressure.  She had undergone laboratory by her primary physician, Dr. Kary Hooper and total cholesterol was 210, triglycerides 77, HDL 72, and LDL 122. She is not on any therapy for lipids. She has a strong family history for CAD in her mother who had significant multivessel CAD and required CABG revascularization. She has rare episodes of GERD for which she takes omeprazole as needed.   She was concerned about underlying coronary artery disease and I recommended a screening coronary calcium score which was done in January 2020.  This demonstrated dense calcification in the proximal to mid LAD and her calcium score was elevated at 122,  representing 99th percentile based on age and sex matched controls.  At that time, she also began to notice a vague sensation of chest fullness.Despite multiple attempts she has been unable to be approved for coronary CTA with FFR. Options for other functional studies have been discussed but have been limited these because of the [place] pandemic. She saw Natalie Hooper, [facility] on 05/15/2019.  She ultimately underwent a routine grade exercise treadmill study since no other evaluations were approved by her insurance.  On the exercise treadmill test, she was able to exercise to a 12.9-met workload and 10 minutes and 50 seconds of exercise.  Peak heart rate was 171, representing 87% of predicted maximum.  She developed a vague chest sensation at stress but this was unassociated with ECG changes of ischemia.  With her abnormal cardiac calcium score, I recommended initiation of statin therapy and since February has been on rosuvastatin 20 mg daily which she has tolerated well.  Previously she had been hesitant to initiate statin therapy.  I have discussed with her the importance of achieving an LDL less than 70 in an attempt to potentially induce plaque stability and with ultimate regression.  Recently she also had noticed some vague palpitations which have improved with further titration of Toprol-XL to 50 mg daily.     I evaluated her in a telemedicine visit on July 01, 2019.  Lipid studies in June 2020 by Dr. Kary Hooper showed improvement with initiation of statin therapy and total cholesterol was 177, triglycerides 118, HDL 83, and  LDL 70.    I last saw her in December 2020.  Over the previous several months she continued to do well.  She denied any recent chest pain.  Her palpitations have stabilized.  She underwent repeat laboratory on September 30, 2019.  Lipid studies were further improved with total cholesterol 156, and LDL cholesterol was now 58.  She denied any myalgias or arthralgias.  She denied palpitations or  chest pain.    Over the past year, Natalie Hooper has remained stable.  She continues to exercise regularly.  She denies any chest pain, PND orthopnea.  She continues to be on Toprol-XL 37.5 mg daily and does not have any palpitations.  She is tolerating rosuvastatin 20 mg for hyperlipidemia and potential plaque regression.  Laboratory from Dr. Kary Hooper on July 06, 2020 showed a total cholesterol 167, triglycerides 150, HDL 79.7, and LDL 57.  She had normal chemistry studies, LFTs, and hemoglobin/hematocrit was 13.1/39.3.  She presents for follow-up evaluation.  Past Medical History:  Diagnosis Date  . Arrhythmia    Hx of Palp.  Marland Kitchen Hypertension   . Mitral regurgitation   . Palpitations   . Unilateral inguinal hernia     Past Surgical History:  Procedure Laterality Date  . COLONOSCOPY WITH PROPOFOL N/A 03/18/2020   Procedure: COLONOSCOPY WITH PROPOFOL;  Surgeon: Toledo, Benay Pike, MD;  Location: [facility] ENDOSCOPY;  Service: Gastroenterology;  Laterality: N/A;  . DILATION AND CURETTAGE OF UTERUS    . DOBUTAMINE STRESS ECHO  05/22/2011   Normal treadmill,normal stress echo  . DOPPLER ECHOCARDIOGRAPHY  10/03/2011   EF = >55%,mild mitral regurg  . ESOPHAGOGASTRODUODENOSCOPY (EGD) WITH PROPOFOL N/A 03/18/2020   Procedure: ESOPHAGOGASTRODUODENOSCOPY (EGD) WITH PROPOFOL;  Surgeon: Toledo, Benay Pike, MD;  Location: [facility] ENDOSCOPY;  Service: Gastroenterology;  Laterality: N/A;  . Event Monitor  06/24/2008   NSR,Sinus Tach  . MYOMECTOMY  2011  . TONSILLECTOMY     age 73    No Known Allergies  Current Outpatient Medications  Medication Sig Dispense Refill  . acetaminophen (TYLENOL) 325 MG tablet Take 650 mg by mouth as needed.    Marland Kitchen ibuprofen (ADVIL,MOTRIN) 200 MG tablet Take 200 mg by mouth as needed.    Marland Kitchen levonorgestrel (MIRENA) 20 MCG/24HR IUD 1 each by Intrauterine route once.    . metoprolol succinate (TOPROL-XL) 25 MG 24 hr tablet TAKE 1.5 TABLETS (37.5 MG TOTAL) BY MOUTH DAILY. 135 tablet 2  .  omeprazole (PRILOSEC) 20 MG capsule Take 1 capsule by mouth as needed. Pt takes 1 tablet daily    . rosuvastatin (CRESTOR) 20 MG tablet TAKE 1 TABLET (20 MG TOTAL) BY MOUTH DAILY. 90 tablet 3   No current facility-administered medications for this visit.    Social History   Socioeconomic History  . Marital status: Married    Spouse name: Not on file  . Number of children: Not on file  . Years of education: Not on file  . Highest education level: Not on file  Occupational History  . Not on file  Tobacco Use  . Smoking status: Former Smoker    Years: 5.00    Types: Cigarettes    Quit date: 12/10/1996    Years since quitting: 23.8  . Smokeless tobacco: Never Used  Vaping Use  . Vaping Use: Never used  Substance and Sexual Activity  . Alcohol use: Yes    Comment: social beer and wine  . Drug use: No  . Sexual activity: Yes    Partners: Male  Birth control/protection: None    Comment: intercourse age 83, less than 5 sexual partners,des neg  Other Topics Concern  . Not on file  Social History Narrative  . Not on file   Social Determinants of Health   Financial Resource Strain:   . Difficulty of Paying Living Expenses: Not on file  Food Insecurity:   . Worried About Charity fundraiser in the Last Year: Not on file  . Ran Out of Food in the Last Year: Not on file  Transportation Needs:   . Lack of Transportation (Medical): Not on file  . Lack of Transportation (Non-Medical): Not on file  Physical Activity:   . Days of Exercise per Week: Not on file  . Minutes of Exercise per Session: Not on file  Stress:   . Feeling of Stress : Not on file  Social Connections:   . Frequency of Communication with Friends and Family: Not on file  . Frequency of Social Gatherings with Friends and Family: Not on file  . Attends Religious Services: Not on file  . Active Member of Clubs or Organizations: Not on file  . Attends Archivist Meetings: Not on file  . Marital Status:  Not on file  Intimate Partner Violence:   . Fear of Current or Ex-Partner: Not on file  . Emotionally Abused: Not on file  . Physically Abused: Not on file  . Sexually Abused: Not on file   Socially she is married and has 2 children, with her oldest now a Museum/gallery exhibitions officer at [employer] and her youngest applying to colleges.  There is no tobacco use. She does drink occasional alcohol.  Family History  Problem Relation Age of Onset  . Heart disease Mother 59  . Colon cancer Mother   . Hypertension Mother   . Hypertension Maternal Grandmother   . Heart disease Maternal Grandmother   . Breast cancer Maternal Grandmother   . Hypertension Maternal Grandfather   . Diabetes Maternal Grandfather   . Heart disease Maternal Grandfather 35  . Diabetes Maternal Aunt   . Colon cancer Maternal Uncle   . Heart disease Maternal Uncle    ROS General: Negative; No fevers, chills, or night sweats;  HEENT: Positive for rare nosebleeds; No changes in vision or hearing, sinus congestion, difficulty swallowing Pulmonary: Negative; No cough, wheezing, shortness of breath, hemoptysis Cardiovascular: Negative; No chest pain, presyncope, syncope, palpitations GI: Positive for mild GERD; No nausea, vomiting, diarrhea, or abdominal pain GU: Negative; No dysuria, hematuria, or difficulty voiding Musculoskeletal: Negative; no myalgias, joint pain, or weakness Hematologic/Oncology: Negative; no easy bruising, bleeding Endocrine: Negative; no heat/cold intolerance; no diabetes Neuro: Negative; no changes in balance, headaches Skin: Negative; No rashes or skin lesions Psychiatric: Negative; No behavioral problems, depression Sleep: Negative; No snoring, daytime sleepiness, hypersomnolence, bruxism, restless legs, hypnogognic hallucinations, no cataplexy Other comprehensive 14 point system review is negative.   PE BP 120/78 (BP Location: Left Arm, Patient Position: Sitting)   Pulse 69   Ht 5' 2"  (1.575 m)   Wt 118 lb  (53.5 kg)   SpO2 99%   BMI 21.58 kg/m    Repeat blood pressure by me was 122/76  Wt Readings from Last 3 Encounters:  10/07/20 118 lb (53.5 kg)  03/18/20 115 lb (52.2 kg)  11/12/19 118 lb (53.5 kg)   General: Alert, oriented, no distress.  Skin: normal turgor, no rashes, warm and dry HEENT: Normocephalic, atraumatic. Pupils equal round and reactive to light; sclera anicteric; extraocular  muscles intact;  Nose without nasal septal hypertrophy Mouth/Parynx benign; Mallinpatti scale 2 Neck: No JVD, no carotid bruits; normal carotid upstroke Lungs: clear to ausculatation and percussion; no wheezing or rales Chest wall: without tenderness to palpitation Heart: PMI not displaced, RRR, s1 s2 normal, 1/6 systolic murmur, no diastolic murmur, no rubs, gallops, thrills, or heaves Abdomen: soft, nontender; no hepatosplenomehaly, BS+; abdominal aorta nontender and not dilated by palpation. Back: no CVA tenderness Pulses 2+ Musculoskeletal: full range of motion, normal strength, no joint deformities Extremities: no clubbing cyanosis or edema, Homan's sign negative  Neurologic: grossly nonfocal; Cranial nerves grossly wnl Psychologic: Normal mood and affect     ECG (independently read by me): NSR at 69; no ectopy. Normal intervals  December 2020 ECG (independently read by me): NSR aT 65; NO ECTOPY; NORMAL INTERVALS  December 2019 ECG (independently read by me): Normal sinus rhythm at 63 bpm.  Mild RV conduction delay.  No ectopy.  Normal intervals.  October 2018 ECG (independently read by me): Normal sinus rhythm at 64 bpm.  September 2017 ECG (independently read by me): Normal sinus rhythm at 70 bpm.  Mild RV conduction delay.  Normal intervals.  February 2016 ECG (independently read by me): Normal sinus rhythm at 65 bpm.  No ectopy.  Normal intervals.  Mild RV conduction delay.  February 2015 ECG (independently read by me): Normal sinus rhythm at 68 beats per minute. Normal intervals.  No ectopy  LABS: BMP Latest Ref Rng & Units 09/30/2019 07/06/2016 07/12/2010  Glucose 65 - 99 mg/dL 87 93 81  BUN 6 - 24 mg/dL 13 15 8   Creatinine 0.57 - 1.00 mg/dL 0.75 0.75 0.66  BUN/Creat Ratio 9 - 23 17 20  -  Sodium 134 - 144 mmol/L 138 140 137  Potassium 3.5 - 5.2 mmol/L 3.9 5.0 3.7  Chloride 96 - 106 mmol/L 102 102 110  CO2 20 - 29 mmol/L 25 23 21   Calcium 8.7 - 10.2 mg/dL 9.2 9.5 7.6(L)   Hepatic Function Latest Ref Rng & Units 09/30/2019 07/06/2016  Total Protein 6.0 - 8.5 g/dL 6.3 6.8  Albumin 3.8 - 4.8 g/dL 4.3 4.5  AST 0 - 40 IU/L 16 20  ALT 0 - 32 IU/L 11 12  Alk Phosphatase 39 - 117 IU/L 47 45  Total Bilirubin 0.0 - 1.2 mg/dL 0.4 0.4   CBC Latest Ref Rng & Units 07/06/2016 07/12/2010  WBC 3.4 - 10.8 x10E3/uL 6.3 8.6  Hemoglobin 11.1 - 15.9 g/dL 13.5 12.6  Hematocrit 34.0 - 46.6 % 38.9 36.6  Platelets 150 - 379 x10E3/uL 278 318   Lab Results  Component Value Date   MCV 91 07/06/2016   MCV 92.5 07/12/2010   Lab Results  Component Value Date   TSH 2.590 07/06/2016   No results found for: HGBA1C  Lipid Panel     Component Value Date/Time   CHOL 156 09/30/2019 0921   TRIG 102 09/30/2019 0921   HDL 80 09/30/2019 0921   CHOLHDL 2.0 09/30/2019 0921   LDLCALC 58 09/30/2019 0921    RADIOLOGY: No results found.  CARDIAC STUDIES  CT CARDIAC SCORING : November 12, 2018. ADDENDUM REPORT: 11/13/2018 13:15  CLINICAL DATA:  Risk stratification  EXAM: Coronary Calcium Score  TECHNIQUE: The patient was scanned on a Siemens Somatom 64 slice scanner. Axial non-contrast 3 mm slices were carried out through the heart. The data set was analyzed on a dedicated work station and scored using the [place].  FINDINGS: Non-cardiac: See separate  report from [facility].  Ascending aorta: Normal diameter 2.9 cm  Pericardium: Normal  Coronary arteries: Dense calcium noted in proximal and mid LAD  IMPRESSION: Coronary calcium score of 122. This  was 35 th percentile for age and sex matched control.  Exercise tolerance test May 27, 2019:  Study Highlights   Blood pressure and heart rate demonstrated a normal response to exercise.  There was no ST segment deviation noted during stress.  Patient had good exercise capacity, exercising for 10 minutes  Study stopped for fatigue and atypical chest pain.    IMPRESSION:  1. Elevated coronary artery calcium score     ASSESSMENT AND PLAN: Natalie Hooper is a very pleasant 49 year old female who has documented normal systolic and diastolic function with flat mitral valve closure and mild mitral regurgitation by an echo Doppler study in 2012. She was not found to have definitive mitral valve prolapse. She has an intermittent click on exam and a systolic murmur suggestive of MR.  Due to her significant family history for CAD, a CT cardiac score was done which revealed an elevated calcium score at 122 which was 99th percentile for age and sex matched control.  Subsequent exercise treadmill testing demonstrated good exercise tolerance without chest pain development or ECG abnormalities.  Based on her coronary calcification and subclinical atherosclerosis she has now been on rosuvastatin 20 mg.  She is tolerating this well.  Subsequent lipid studies since initiating rosuvastatin have remained excellent with LDL cholesterol in the mid to upper 50s.  She continues to do well and exercises regularly.  She is without any chest pain symptoms or exertional dyspnea.  I reviewed her most recent laboratory from Dr. Kary Hooper.  She is tolerating metoprolol succinate 37.5 mg and denies any recurrent pallor notations.  She will continue current therapy as prescribed.  I will see her in 1 year for reevaluation or sooner as necessary.  Troy Sine, MD, [facility]  10/07/2020 2:04 PM

## 2020-10-07 NOTE — Patient Instructions (Signed)
Medication Instructions:  No changes *If you need a refill on your cardiac medications before your next appointment, please call your pharmacy*   Lab Work: None ordered If you have labs (blood work) drawn today and your tests are completely normal, you will receive your results only by: Marland Kitchen MyChart Message (if you have MyChart) OR . A paper copy in the mail If you have any lab test that is abnormal or we need to change your treatment, we will call you to review the results.   Testing/Procedures: None ordered   Follow-Up: At [facility], you and your health needs are our priority.  As part of our continuing mission to provide you with exceptional heart care, we have created designated Provider Care Teams.  These Care Teams include your primary Cardiologist (physician) and Advanced Practice Providers (APPs -  Physician Assistants and Nurse Practitioners) who all work together to provide you with the care you need, when you need it.  We recommend signing up for the patient portal called "MyChart".  Sign up information is provided on this After Visit Summary.  MyChart is used to connect with patients for Virtual Visits (Telemedicine).  Patients are able to view lab/test results, encounter notes, upcoming appointments, etc.  Non-urgent messages can be sent to your provider as well.   To learn more about what you can do with MyChart, go to [email or web address].    Your next appointment:   12 month(s)  The format for your next appointment:   In Person  Provider:   Shelva Majestic, MD  Your physician wants you to follow-up in: 12 months. You will receive a reminder letter in the mail two months in advance. If you don't receive a letter, please call our office to schedule the follow-up appointment.   Other Instructions None

## 2020-10-09 ENCOUNTER — Encounter: Payer: Self-pay | Admitting: Cardiovascular Disease

## 2020-10-13 ENCOUNTER — Encounter: Payer: 59 | Admitting: Dermatology

## 2020-11-16 ENCOUNTER — Encounter: Payer: 59 | Admitting: Obstetrics & Gynecology

## 2020-12-10 ENCOUNTER — Other Ambulatory Visit: Payer: Self-pay | Admitting: Cardiovascular Disease

## 2021-01-11 ENCOUNTER — Ambulatory Visit: Payer: 59 | Admitting: Obstetrics & Gynecology

## 2021-02-07 ENCOUNTER — Ambulatory Visit: Payer: 59 | Admitting: Obstetrics & Gynecology

## 2021-02-09 ENCOUNTER — Ambulatory Visit (INDEPENDENT_AMBULATORY_CARE_PROVIDER_SITE_OTHER): Payer: 59 | Admitting: Obstetrics & Gynecology

## 2021-02-09 ENCOUNTER — Other Ambulatory Visit (HOSPITAL_COMMUNITY)
Admission: RE | Admit: 2021-02-09 | Discharge: 2021-02-09 | Disposition: A | Discharge status: Home / Self Care | Payer: 59 | Source: Ambulatory Visit | Attending: Obstetrics & Gynecology | Admitting: Obstetrics & Gynecology

## 2021-02-09 ENCOUNTER — Encounter: Payer: Self-pay | Admitting: Obstetrics & Gynecology

## 2021-02-09 ENCOUNTER — Other Ambulatory Visit: Payer: Self-pay

## 2021-02-09 VITALS — BP 134/88 | Ht 61.75 in | Wt 116.0 lb

## 2021-02-09 DIAGNOSIS — Z01419 Encounter for gynecological examination (general) (routine) without abnormal findings: Secondary | ICD-10-CM

## 2021-02-09 DIAGNOSIS — Z30431 Encounter for routine checking of intrauterine contraceptive device: Secondary | ICD-10-CM | POA: Diagnosis not present

## 2021-02-09 NOTE — Progress Notes (Signed)
Natalie Hooper 1971-04-06 [id number]   History:    50 y.o. G2P2L2 Married.  Sons 81 and 56 yo.  [id number] presenting for annual gyn exam   JKK:XFGH on Mirena IUD since July 25, 2018. No breakthrough bleeding. No pelvic pain. No pain with intercourse. Urine and bowel movements normal. Breasts normal. Body mass index 21.39. Physically active. Health labs all normal with family physician.  Followed by Cardio.   Past medical history,surgical history, family history and social history were all reviewed and documented in the EPIC chart.  Gynecologic History No LMP recorded. (Menstrual status: IUD).  Obstetric History OB History  Gravida Para Term Preterm AB Living  2       0 2  SAB IAB Ectopic Multiple Live Births      0        # Outcome Date GA Lbr Len/2nd Weight Sex Delivery Anes PTL Lv  2 Gravida           1 Gravida              ROS: A ROS was performed and pertinent positives and negatives are included in the history.  GENERAL: No fevers or chills. HEENT: No change in vision, no earache, sore throat or sinus congestion. NECK: No pain or stiffness. CARDIOVASCULAR: No chest pain or pressure. No palpitations. PULMONARY: No shortness of breath, cough or wheeze. GASTROINTESTINAL: No abdominal pain, nausea, vomiting or diarrhea, melena or bright red blood per rectum. GENITOURINARY: No urinary frequency, urgency, hesitancy or dysuria. MUSCULOSKELETAL: No joint or muscle pain, no back pain, no recent trauma. DERMATOLOGIC: No rash, no itching, no lesions. ENDOCRINE: No polyuria, polydipsia, no heat or cold intolerance. No recent change in weight. HEMATOLOGICAL: No anemia or easy bruising or bleeding. NEUROLOGIC: No headache, seizures, numbness, tingling or weakness. PSYCHIATRIC: No depression, no loss of interest in normal activity or change in sleep pattern.     Exam:   BP 134/88   Ht 5' 1.75" (1.568 m)   Wt 116 lb (52.6 kg)   BMI 21.39 kg/m   Body mass  index is 21.39 kg/m.  General appearance : Well developed well nourished female. No acute distress HEENT: Eyes: no retinal hemorrhage or exudates,  Neck supple, trachea midline, no carotid bruits, no thyroidmegaly Lungs: Clear to auscultation, no rhonchi or wheezes, or rib retractions  Heart: Regular rate and rhythm, no murmurs or gallops Breast:Examined in sitting and supine position were symmetrical in appearance, no palpable masses or tenderness,  no skin retraction, no nipple inversion, no nipple discharge, no skin discoloration, no axillary or supraclavicular lymphadenopathy Abdomen: no palpable masses or tenderness, no rebound or guarding Extremities: no edema or skin discoloration or tenderness  Pelvic: Vulva: Normal             Vagina: No gross lesions or discharge  Cervix: No gross lesions or discharge. IUD strings visible at the EO. Pap reflex done.  Uterus  AV, normal size, shape and consistency, non-tender and mobile  Adnexa  Without masses or tenderness  Anus: Normal   Assessment/Plan:  50 y.o. female for annual exam   1. Encounter for routine gynecological examination with Papanicolaou smear of cervix Normal gynecologic exam.  Pap reflex done.  Breast exam normal.  Screening mammogram September 2021.  Colonoscopy May 2021.  Health labs with family physician.  Good body mass index at 21.39.  Continue with fitness and healthy nutrition.  2. Encounter for routine checking of intrauterine contraceptive device (IUD) Mirena IUD  since September 2019.  Well-tolerated and in good position without complication.  Princess Bruins MD, 1:49 PM 02/09/2021

## 2021-02-11 LAB — CYTOLOGY - PAP: Diagnosis: NEGATIVE

## 2021-02-16 ENCOUNTER — Encounter: Payer: Self-pay | Admitting: Obstetrics & Gynecology

## 2021-09-10 ENCOUNTER — Other Ambulatory Visit: Payer: Self-pay | Admitting: Cardiovascular Disease

## 2021-09-26 ENCOUNTER — Other Ambulatory Visit: Payer: Self-pay | Admitting: Obstetrics & Gynecology

## 2021-09-26 DIAGNOSIS — Z1231 Encounter for screening mammogram for malignant neoplasm of breast: Secondary | ICD-10-CM

## 2021-10-27 ENCOUNTER — Ambulatory Visit
Admission: RE | Admit: 2021-10-27 | Discharge: 2021-10-27 | Disposition: A | Discharge status: Home / Self Care | Payer: 59 | Source: Ambulatory Visit | Attending: Obstetrics & Gynecology | Admitting: Obstetrics & Gynecology

## 2021-10-27 ENCOUNTER — Other Ambulatory Visit: Payer: Self-pay

## 2021-10-27 DIAGNOSIS — Z1231 Encounter for screening mammogram for malignant neoplasm of breast: Secondary | ICD-10-CM

## 2021-12-14 DIAGNOSIS — C44519 Basal cell carcinoma of skin of other part of trunk: Secondary | ICD-10-CM | POA: Diagnosis not present

## 2022-02-22 ENCOUNTER — Encounter: Payer: Self-pay | Admitting: Obstetrics & Gynecology

## 2022-02-22 ENCOUNTER — Ambulatory Visit (INDEPENDENT_AMBULATORY_CARE_PROVIDER_SITE_OTHER): Payer: 59 | Admitting: Obstetrics & Gynecology

## 2022-02-22 VITALS — BP 120/80 | HR 70 | Resp 16 | Ht 61.75 in | Wt 115.0 lb

## 2022-02-22 DIAGNOSIS — Z30431 Encounter for routine checking of intrauterine contraceptive device: Secondary | ICD-10-CM

## 2022-02-22 DIAGNOSIS — Z01419 Encounter for gynecological examination (general) (routine) without abnormal findings: Secondary | ICD-10-CM

## 2022-02-22 NOTE — Progress Notes (Signed)
? ? ?Natalie Hooper 20-Jul-1971 [id number] ? ? ?History:    51 y.o.  G2P2L2 Married.  Sons 8 and 16 yo.  Just back from a Cat[place]aran trip around the [place]. ?  ?RP:  Established patient presenting for annual gyn exam  ?  ?HPI: Well on Mirena IUD since July 25, 2018.  No breakthrough bleeding.  No hot flushes or night sweats.  No pelvic pain.  No pain with intercourse.  Pap Neg 02/2021.  Urine and bowel movements normal.  Colono 03/2020. Breasts normal. Mammo Neg 10/2021.  Body mass index 21.2. Physically active, walking everyday.  Health labs all normal with family physician.  Followed by Cardio. ?  ? ?Past medical history,surgical history, family history and social history were all reviewed and documented in the EPIC chart. ? ?Gynecologic History ?No LMP recorded. (Menstrual status: IUD). ? ?Obstetric History ?OB History  ?Gravida Para Term Preterm AB Living  ?'2 2 1 1 '$ 0 2  ?SAB IAB Ectopic Multiple Live Births  ?    0      ?  ?# Outcome Date GA Lbr Len/2nd Weight Sex Delivery Anes PTL Lv  ?2 Preterm           ?1 Term           ? ? ? ?ROS: A ROS was performed and pertinent positives and negatives are included in the history. ? GENERAL: No fevers or chills. HEENT: No change in vision, no earache, sore throat or sinus congestion. NECK: No pain or stiffness. CARDIOVASCULAR: No chest pain or pressure. No palpitations. PULMONARY: No shortness of breath, cough or wheeze. GASTROINTESTINAL: No abdominal pain, nausea, vomiting or diarrhea, melena or bright red blood per rectum. GENITOURINARY: No urinary frequency, urgency, hesitancy or dysuria. MUSCULOSKELETAL: No joint or muscle pain, no back pain, no recent trauma. DERMATOLOGIC: No rash, no itching, no lesions. ENDOCRINE: No polyuria, polydipsia, no heat or cold intolerance. No recent change in weight. HEMATOLOGICAL: No anemia or easy bruising or bleeding. NEUROLOGIC: No headache, seizures, numbness, tingling or weakness. PSYCHIATRIC: No depression, no loss  of interest in normal activity or change in sleep pattern.  ?  ? ?Exam: ? ? ?BP 120/80   Pulse 70   Resp 16   Ht 5' 1.75" (1.568 m)   Wt 115 lb (52.2 kg)   BMI 21.20 kg/m?  ? ?Body mass index is 21.2 kg/m?. ? ?General appearance : Well developed well nourished female. No acute distress ?HEENT: Eyes: no retinal hemorrhage or exudates,  Neck supple, trachea midline, no carotid bruits, no thyroidmegaly ?Lungs: Clear to auscultation, no rhonchi or wheezes, or rib retractions  ?Heart: Regular rate and rhythm, no murmurs or gallops ?Breast:Examined in sitting and supine position were symmetrical in appearance, no palpable masses or tenderness,  no skin retraction, no nipple inversion, no nipple discharge, no skin discoloration, no axillary or supraclavicular lymphadenopathy ?Abdomen: no palpable masses or tenderness, no rebound or guarding ?Extremities: no edema or skin discoloration or tenderness ? ?Pelvic: Vulva: Normal ?            Vagina: No gross lesions or discharge ? Cervix: No gross lesions or discharge.  IUD strings felt at the [facility]. ? Uterus  AV, normal size, shape and consistency, non-tender and mobile ? Adnexa  Without masses or tenderness ? Anus: Normal ? ? ?Assessment/Plan:  51 y.o. female for annual exam  ? ?1. Well female exam with routine gynecological exam ?Well on Mirena IUD since July 25, 2018.  No breakthrough bleeding.  No pelvic pain.  No pain with intercourse.  Pap Neg 02/2021.  Urine and bowel movements normal.  Colono 03/2020. Breasts normal. Mammo Neg 10/2021.  Body mass index 21.2. Physically active.  Health labs all normal with family physician.  Followed by Cardio. ?  ?2. Encounter for routine checking of intrauterine contraceptive device (IUD)  ?Well on Mirena IUD since July 25, 2018.  No breakthrough bleeding.  No pelvic pain.  No pain with intercourse. IUD in good position. ? ?Princess Bruins MD, 1:45 PM 02/22/2022 ? ?  ?

## 2022-03-27 DIAGNOSIS — L814 Other melanin hyperpigmentation: Secondary | ICD-10-CM | POA: Diagnosis not present

## 2022-03-27 DIAGNOSIS — D2272 Melanocytic nevi of left lower limb, including hip: Secondary | ICD-10-CM | POA: Diagnosis not present

## 2022-03-27 DIAGNOSIS — D2261 Melanocytic nevi of right upper limb, including shoulder: Secondary | ICD-10-CM | POA: Diagnosis not present

## 2022-03-27 DIAGNOSIS — L821 Other seborrheic keratosis: Secondary | ICD-10-CM | POA: Diagnosis not present

## 2022-03-27 DIAGNOSIS — X32XXXA Exposure to sunlight, initial encounter: Secondary | ICD-10-CM | POA: Diagnosis not present

## 2022-03-27 DIAGNOSIS — D2262 Melanocytic nevi of left upper limb, including shoulder: Secondary | ICD-10-CM | POA: Diagnosis not present

## 2022-03-27 DIAGNOSIS — D225 Melanocytic nevi of trunk: Secondary | ICD-10-CM | POA: Diagnosis not present

## 2022-03-27 DIAGNOSIS — D2271 Melanocytic nevi of right lower limb, including hip: Secondary | ICD-10-CM | POA: Diagnosis not present

## 2022-09-19 DIAGNOSIS — Z0000 Encounter for general adult medical examination without abnormal findings: Secondary | ICD-10-CM | POA: Diagnosis not present

## 2022-09-25 DIAGNOSIS — K219 Gastro-esophageal reflux disease without esophagitis: Secondary | ICD-10-CM | POA: Insufficient documentation

## 2022-09-25 DIAGNOSIS — Z0000 Encounter for general adult medical examination without abnormal findings: Secondary | ICD-10-CM | POA: Diagnosis not present

## 2022-09-25 DIAGNOSIS — I10 Essential (primary) hypertension: Secondary | ICD-10-CM | POA: Diagnosis not present

## 2022-09-25 DIAGNOSIS — E785 Hyperlipidemia, unspecified: Secondary | ICD-10-CM | POA: Diagnosis not present

## 2022-10-04 ENCOUNTER — Ambulatory Visit: Payer: 59 | Attending: Cardiovascular Disease | Admitting: Cardiovascular Disease

## 2022-10-04 ENCOUNTER — Encounter: Payer: Self-pay | Admitting: Cardiovascular Disease

## 2022-10-04 DIAGNOSIS — R002 Palpitations: Secondary | ICD-10-CM

## 2022-10-04 DIAGNOSIS — R931 Abnormal findings on diagnostic imaging of heart and coronary circulation: Secondary | ICD-10-CM

## 2022-10-04 DIAGNOSIS — K219 Gastro-esophageal reflux disease without esophagitis: Secondary | ICD-10-CM | POA: Diagnosis not present

## 2022-10-04 DIAGNOSIS — E785 Hyperlipidemia, unspecified: Secondary | ICD-10-CM | POA: Diagnosis not present

## 2022-10-04 DIAGNOSIS — Z8249 Family history of ischemic heart disease and other diseases of the circulatory system: Secondary | ICD-10-CM | POA: Diagnosis not present

## 2022-10-04 MED ORDER — EZETIMIBE 10 MG PO TABS: 10.0000 mg | ORAL_TABLET | Freq: Every day | ORAL | 3 refills | Status: DC

## 2022-10-04 NOTE — Patient Instructions (Addendum)
Medication Instructions:  Your physician recommends that you continue on your current medications as directed. Please refer to the Current Medication list given to you today.  *If you need a refill on your cardiac medications before your next appointment, please call your pharmacy*   Lab Work: Your physician recommends that you return for lab work in: At your convenience LP(a)  If you have labs (blood work) drawn today and your tests are completely normal, you will receive your results only by: [place] (if you have MyChart) OR A paper copy in the mail If you have any lab test that is abnormal or we need to change your treatment, we will call you to review the results.   Follow-Up: At [facility], you and your health needs are our priority.  As part of our continuing mission to provide you with exceptional heart care, we have created designated Provider Care Teams.  These Care Teams include your primary Cardiologist (physician) and Advanced Practice Providers (APPs -  Physician Assistants and Nurse Practitioners) who all work together to provide you with the care you need, when you need it.  We recommend signing up for the patient portal called "MyChart".  Sign up information is provided on this After Visit Summary.  MyChart is used to connect with patients for Virtual Visits (Telemedicine).  Patients are able to view lab/test results, encounter notes, upcoming appointments, etc.  Non-urgent messages can be sent to your provider as well.   To learn more about what you can do with MyChart, go to [email or web address].    Your next appointment:   12 month(s)  The format for your next appointment:   In Person  Provider Shelva Majestic, MD

## 2022-10-04 NOTE — Progress Notes (Unsigned)
Patient ID: Natalie Hooper, female   DOB: [birth year], 51 y.o.   MRN: [id number]      Primary M.D.: [name]  HPI: Natalie Hooper is a 51 y.o. female who presents for a 24 month follow-up cardiology evaluation. She is the daughter of  Natalie Hooper.  [name] has a history of palpitations.  An echo Doppler study November 2012 showed normal systolic and diastolic function. She had flat mitral valve coaptation without definitive prolapse and mild mitral regurgitation with trivial tricuspid regurgitation. She had normal pulmonary pressures.  She has been on metoprolol succinate at 37.5 mg daily which has essentially controlled her palpitations. There is a very rare instance where she does note an occasional palpitation and she has taken an extra half of this metoprolol.  This usually occurs when she is resting under periods of increased stress.    I last saw her in October 2018.  At that time, she was doing well.  Palpitations were controlled with metoprolol.  She was sizing daily and remained active.    When I saw her in December 2019 she remained active and was often walking several miles and exercising daily.  She denied chest tightness or pressure.  She often walks several miles and exercises daily.  She denies any episodes of chest tightness or pressure.  She had undergone laboratory by her primary physician, Dr. Kary Kos and total cholesterol was 210, triglycerides 77, HDL 72, and LDL 122.  She is not on any therapy for lipids.  She has a strong family history for CAD in her mother who had significant multivessel CAD and required CABG revascularization.  She has rare episodes of GERD for which she takes omeprazole as needed.    She was concerned about underlying coronary artery disease and I recommended a screening coronary calcium score which was done in January 2020.  This demonstrated dense calcification in the proximal to mid LAD and her calcium score was elevated at 122, representing  99th percentile based on age and sex matched controls.  At that time, she also began to notice a vague sensation of chest fullness. Despite multiple attempts she has been unable to be approved for coronary CTA with FFR.  Options for other functional studies have been discussed but have been limited these because of the [place] pandemic. She saw Kerin Hooper, [facility] on 05/15/2019.  She ultimately underwent a routine grade exercise treadmill study since no other evaluations were approved by her insurance.  On the exercise treadmill test, she was able to exercise to a 12.9-met workload and 10 minutes and 50 seconds of exercise.  Peak heart rate was 171, representing 87% of predicted maximum.  She developed a vague chest sensation at stress but this was unassociated with ECG changes of ischemia.   With her abnormal cardiac calcium score, I recommended initiation of statin therapy and since February has been on rosuvastatin 20 mg daily which she has tolerated well.  Previously she had been hesitant to initiate statin therapy.  I have discussed with her the importance of achieving an LDL less than 70 in an attempt to potentially induce plaque stability and with ultimate regression.  Recently she also had noticed some vague palpitations which have improved with further titration of Toprol-XL to 50 mg daily.     I evaluated her in a telemedicine visit on July 01, 2019.  Lipid studies in June 2020 by Dr. Kary Kos showed improvement with initiation of statin therapy and total cholesterol was  177, triglycerides 118, HDL 83, and LDL 70.    I saw her in December 2020.  Over the previous several months she continued to do well.  She denied any recent chest pain.  Her palpitations have stabilized.  She underwent repeat laboratory on September 30, 2019.  Lipid studies were further improved with total cholesterol 156, and LDL cholesterol was now 58.  She denied any myalgias or arthralgias.  She denied palpitations or chest pain.     I last saw her on October 07, 2020 at which time she remained stable.  She was continuing to exercise regularly.  She denied any chest pain, PND orthopnea.  She continues to be on Toprol-XL 37.5 mg daily and does not have any palpitations.  She is tolerating rosuvastatin 20 mg for hyperlipidemia for potential plaque regression.  Laboratory from Dr. Kary Kos on July 06, 2020 showed a total cholesterol 167, triglycerides 150, HDL 79.7, and LDL 57.  She had normal chemistry studies, LFTs, and hemoglobin/hematocrit was 13.1/39.3.    Since I last saw her, she has continued to do well.  Her to children are both at [place] [facility] state.  She continues to exercise regularly.  She sees Dr. Kary Kos for primary care and recently saw him on September 25, 2022.  She underwent laboratory on September 19, 2022 which showed hemoglobin 12.8 hematocrit 37.9.  Lipid studies revealed total cholesterol 176, triglycerides 112, HDL 95 and LDL 58.  Chemistry was normal.  LFTs were normal.  She continues to be on metoprolol succinate 37.5 mg daily which controls palpitations and she is on rosuvastatin 20 mg daily which she is tolerating.  She takes omeprazole as needed for GERD.  She presents for evaluation.   Past Medical History:  Diagnosis Date   Arrhythmia    Hx of Palp.   Hypertension    Mitral regurgitation    Palpitations    Unilateral inguinal hernia     Past Surgical History:  Procedure Laterality Date   COLONOSCOPY WITH PROPOFOL N/A 03/18/2020   Procedure: COLONOSCOPY WITH PROPOFOL;  Surgeon: Toledo, Benay Pike, MD;  Location: [facility] ENDOSCOPY;  Service: Gastroenterology;  Laterality: N/A;   DILATION AND CURETTAGE OF UTERUS     DOBUTAMINE STRESS ECHO  05/22/2011   Normal treadmill,normal stress echo   DOPPLER ECHOCARDIOGRAPHY  10/03/2011   EF = >55%,mild mitral regurg   ESOPHAGOGASTRODUODENOSCOPY (EGD) WITH PROPOFOL N/A 03/18/2020   Procedure: ESOPHAGOGASTRODUODENOSCOPY (EGD) WITH PROPOFOL;  Surgeon: Toledo, Benay Pike, MD;  Location: [facility] ENDOSCOPY;  Service: Gastroenterology;  Laterality: N/A;   Event Monitor  06/24/2008   NSR,Sinus Tach   INTRAUTERINE DEVICE (IUD) INSERTION     mirena inserted 07-25-18   MYOMECTOMY  2011   TONSILLECTOMY     age 63    No Known Allergies  Current Outpatient Medications  Medication Sig Dispense Refill   acetaminophen (TYLENOL) 325 MG tablet Take 650 mg by mouth as needed.     ibuprofen (ADVIL,MOTRIN) 200 MG tablet Take 200 mg by mouth as needed.     levonorgestrel (MIRENA) 20 MCG/24HR IUD 1 each by Intrauterine route once.     metoprolol succinate (TOPROL-XL) 25 MG 24 hr tablet TAKE 1.5 TABLETS BY MOUTH DAILY. 135 tablet 3   omeprazole (PRILOSEC) 20 MG capsule Take 1 capsule by mouth as needed. Pt takes 1 tablet daily     rosuvastatin (CRESTOR) 20 MG tablet TAKE 1 TABLET (20 MG TOTAL) BY MOUTH DAILY. 90 tablet 3   No current facility-administered medications for  this visit.    Social History   Socioeconomic History   Marital status: Married    Spouse name: Not on file   Number of children: Not on file   Years of education: Not on file   Highest education level: Not on file  Occupational History   Not on file  Tobacco Use   Smoking status: Former    Years: 5.00    Types: Cigarettes    Quit date: 12/10/1996    Years since quitting: 25.8   Smokeless tobacco: Never  Vaping Use   Vaping Use: Never used  Substance and Sexual Activity   Alcohol use: Yes    Comment: social beer and wine   Drug use: No   Sexual activity: Yes    Partners: Male    Birth control/protection: I.U.D.    Comment: intercourse age 69, less than 5 sexual partners,des neg  Other Topics Concern   Not on file  Social History Narrative   Not on file   Social Determinants of Health   Financial Resource Strain: Not on file  Food Insecurity: Not on file  Transportation Needs: Not on file  Physical Activity: Not on file  Stress: Not on file  Social Connections: Not on file   Intimate Partner Violence: Not on file   Socially she is married and has 2 children, with her oldest now a Museum/gallery exhibitions officer at [employer] and her youngest applying to colleges.  There is no tobacco use. She does drink occasional alcohol.  Family History  Problem Relation Age of Onset   Heart disease Mother 27       triple bypass   Hypertension Mother    Lung cancer Mother    Leukemia Mother    Diabetes Maternal Aunt    Colon cancer Maternal Uncle    Heart disease Maternal Uncle    Hypertension Maternal Grandmother    Heart disease Maternal Grandmother    Breast cancer Maternal Grandmother    Hypertension Maternal Grandfather    Diabetes Maternal Grandfather    Heart disease Maternal Grandfather 55   ROS General: Negative; No fevers, chills, or night sweats;  HEENT: Positive for rare nosebleeds; No changes in vision or hearing, sinus congestion, difficulty swallowing Pulmonary: Negative; No cough, wheezing, shortness of breath, hemoptysis Cardiovascular: Negative; No chest pain, presyncope, syncope, palpitations GI: Positive for mild GERD; No nausea, vomiting, diarrhea, or abdominal pain GU: Negative; No dysuria, hematuria, or difficulty voiding Musculoskeletal: Negative; no myalgias, joint pain, or weakness Hematologic/Oncology: Negative; no easy bruising, bleeding Endocrine: Negative; no heat/cold intolerance; no diabetes Neuro: Negative; no changes in balance, headaches Skin: Negative; No rashes or skin lesions Psychiatric: Negative; No behavioral problems, depression Sleep: Negative; No snoring, daytime sleepiness, hypersomnolence, bruxism, restless legs, hypnogognic hallucinations, no cataplexy Other comprehensive 14 point system review is negative.   PE BP 128/78   Pulse 65   Ht _0  (1.575 m)   Wt 121 lb 9.6 oz (55.2 kg)   SpO2 100%   BMI 22.24 kg/m    Repeat blood pressure by me was 120/70  Wt Readings from Last 3 Encounters:  10/04/22 121 lb 9.6 oz (55.2 kg)   02/22/22 115 lb (52.2 kg)  02/09/21 116 lb (52.6 kg)   General: Alert, oriented, no distress.  Skin: normal turgor, no rashes, warm and dry HEENT: Normocephalic, atraumatic. Pupils equal round and reactive to light; sclera anicteric; extraocular muscles intact; Nose without nasal septal hypertrophy Mouth/Parynx benign; Mallinpatti scale 3 Neck: No JVD, no  carotid bruits; normal carotid upstroke Lungs: clear to ausculatation and percussion; no wheezing or rales Chest wall: without tenderness to palpitation Heart: PMI not displaced, RRR, s1 s2 normal, 1/6 systolic murmur, no diastolic murmur, no rubs, gallops, thrills, or heaves Abdomen: soft, nontender; no hepatosplenomehaly, BS+; abdominal aorta nontender and not dilated by palpation. Back: no CVA tenderness Pulses 2+ Musculoskeletal: full range of motion, normal strength, no joint deformities Extremities: no clubbing cyanosis or edema, Homan's sign negative  Neurologic: grossly nonfocal; Cranial nerves grossly wnl Psychologic: Normal mood and affect    October 04, 2022 ECG (independently read by me): NSR at 65, normal intervals  October 07, 2020 ECG (independently read by me): NSR at 69; no ectopy. Normal intervals  December 2020 ECG (independently read by me): NSR aT 65; NO ECTOPY; NORMAL INTERVALS  December 2019 ECG (independently read by me): Normal sinus rhythm at 63 bpm.  Mild RV conduction delay.  No ectopy.  Normal intervals.  October 2018 ECG (independently read by me): Normal sinus rhythm at 64 bpm.  September 2017 ECG (independently read by me): Normal sinus rhythm at 70 bpm.  Mild RV conduction delay.  Normal intervals.  February 2016 ECG (independently read by me): Normal sinus rhythm at 65 bpm.  No ectopy.  Normal intervals.  Mild RV conduction delay.  February 2015 ECG (independently read by me): Normal sinus rhythm at 68 beats per minute. Normal intervals. No ectopy  LABS:    Latest Ref Rng & Units 09/30/2019     9:21 AM 07/06/2016    8:03 AM 07/12/2010   11:36 AM  BMP  Glucose 65 - 99 mg/dL 87  93  81   BUN 6 - 24 mg/dL _0 Creatinine 0.57 - 1.00 mg/dL 0.75  0.75  0.66   BUN/Creat Ratio 9 - _1 Sodium 134 - 144 mmol/L 138  140  137   Potassium 3.5 - 5.2 mmol/L 3.9  5.0  3.7   Chloride 96 - 106 mmol/L 102  102  110   CO2 20 - 29 mmol/L _2 Calcium 8.7 - 10.2 mg/dL 9.2  9.5  7.6       Latest Ref Rng & Units 09/30/2019    9:21 AM 07/06/2016    8:03 AM  Hepatic Function  Total Protein 6.0 - 8.5 g/dL 6.3  6.8   Albumin 3.8 - 4.8 g/dL 4.3  4.5   AST 0 - 40 IU/L 16  20   ALT 0 - 32 IU/L 11  12   Alk Phosphatase 39 - 117 IU/L 47  45   Total Bilirubin 0.0 - 1.2 mg/dL 0.4  0.4       Latest Ref Rng & Units 07/06/2016    8:03 AM 07/12/2010    9:05 AM  CBC  WBC 3.4 - 10.8 x10E3/uL 6.3  8.6   Hemoglobin 11.1 - 15.9 g/dL 13.5  12.6   Hematocrit 34.0 - 46.6 % 38.9  36.6   Platelets 150 - 379 x10E3/uL 278  318    Lab Results  Component Value Date   MCV 91 07/06/2016   MCV 92.5 07/12/2010   Lab Results  Component Value Date   TSH 2.590 07/06/2016   No results found for: "HGBA1C"  Lipid Panel     Component Value Date/Time   CHOL 156 09/30/2019 0921   TRIG 102 09/30/2019 0921   HDL 80 09/30/2019  8453   CHOLHDL 2.0 09/30/2019 0921   LDLCALC 58 09/30/2019 0921    RADIOLOGY: No results found.  CARDIAC STUDIES  CT CARDIAC SCORING : November 12, 2018. ADDENDUM REPORT: 11/13/2018 13:15   CLINICAL DATA:  Risk stratification   EXAM: Coronary Calcium Score   TECHNIQUE: The patient was scanned on a Siemens Somatom 64 slice scanner. Axial non-contrast 3 mm slices were carried out through the heart. The data set was analyzed on a dedicated work station and scored using the [place].   FINDINGS: Non-cardiac: See separate report from [facility].   Ascending aorta: Normal diameter 2.9 cm   Pericardium: Normal   Coronary arteries: Dense  calcium noted in proximal and mid LAD   IMPRESSION: Coronary calcium score of 122. This was 30 th percentile for age and sex matched control.   Exercise tolerance test May 27, 2019:  Study Highlights  Blood pressure and heart rate demonstrated a normal response to exercise. There was no ST segment deviation noted during stress. Patient had good exercise capacity, exercising for 10 minutes Study stopped for fatigue and atypical chest pain.    IMPRESSION:  1. Elevated coronary artery calcium score: January 2020 at 122; 99th percentile    2. Hyperlipidemia with target LDL less than 70   3. Palpitations   4. FH: CAD (coronary artery disease)   5. Gastroesophageal reflux disease without esophagitis     ASSESSMENT AND PLAN: Ms. Kloee Ballew is a very pleasant 51 year-old female who has documented normal systolic and diastolic function with flat mitral valve closure and mild mitral regurgitation by an echo Doppler study in 2012. She was not found to have definitive mitral valve prolapse. She has an intermittent click on exam and a systolic murmur suggestive of MR.  Due to her significant family history for CAD, a CT cardiac score was done which revealed an elevated calcium score at 122 which was 99th percentile for age and sex matched control.  Subsequent exercise treadmill testing demonstrated good exercise tolerance without chest pain development or ECG abnormalities.  Based on her coronary calcification and subclinical atherosclerosis she has been treated with rosuvastatin 20 mg.  Subsequent lipid studies have been excellent with LDLs typically in the mid to upper 50s.  Her most recent laboratory from September 19, 2022 was reviewed which showed total cholesterol at 176, triglycerides 112, excellent HDL at 95, and LDL at 58.  She does not have any symptoms of chest pain or exertional dyspnea.  She is tolerating metoprolol succinate 37.5 mg daily.  Her blood pressure is stable and she is unaware of  any of previous palpitations.  I have recommended she continue current therapy.  I also recommended that at her next laboratory that LP(a) be obtained for further assessment.  She takes omeprazole as needed for GERD.  I will see her in 1 year for reevaluation or sooner as needed.  Troy Sine, MD, [facility]  10/05/2022 4:25 PM

## 2022-10-05 ENCOUNTER — Encounter: Payer: Self-pay | Admitting: Cardiovascular Disease

## 2022-10-06 NOTE — Addendum Note (Signed)
Addended by: Hinton Dyer on: 10/06/2022 02:30 PM   Modules accepted: Orders

## 2022-11-21 ENCOUNTER — Other Ambulatory Visit: Payer: Self-pay | Admitting: Obstetrics & Gynecology

## 2022-11-21 DIAGNOSIS — Z1231 Encounter for screening mammogram for malignant neoplasm of breast: Secondary | ICD-10-CM

## 2022-11-29 DIAGNOSIS — D2271 Melanocytic nevi of right lower limb, including hip: Secondary | ICD-10-CM | POA: Diagnosis not present

## 2022-11-29 DIAGNOSIS — L821 Other seborrheic keratosis: Secondary | ICD-10-CM | POA: Diagnosis not present

## 2022-11-29 DIAGNOSIS — D225 Melanocytic nevi of trunk: Secondary | ICD-10-CM | POA: Diagnosis not present

## 2022-11-29 DIAGNOSIS — D2262 Melanocytic nevi of left upper limb, including shoulder: Secondary | ICD-10-CM | POA: Diagnosis not present

## 2022-11-29 DIAGNOSIS — D2272 Melanocytic nevi of left lower limb, including hip: Secondary | ICD-10-CM | POA: Diagnosis not present

## 2022-11-29 DIAGNOSIS — D2261 Melanocytic nevi of right upper limb, including shoulder: Secondary | ICD-10-CM | POA: Diagnosis not present

## 2022-12-05 ENCOUNTER — Ambulatory Visit: Payer: 59

## 2022-12-27 LAB — LIPOPROTEIN A (LPA): Lipoprotein (a): 196 nmol/L — ABNORMAL HIGH (ref <75.0)

## 2023-01-23 ENCOUNTER — Ambulatory Visit
Admission: RE | Admit: 2023-01-23 | Discharge: 2023-01-23 | Disposition: A | Discharge status: Home / Self Care | Payer: 59 | Source: Ambulatory Visit | Attending: Obstetrics & Gynecology | Admitting: Obstetrics & Gynecology

## 2023-01-23 ENCOUNTER — Telehealth: Payer: Self-pay | Admitting: Cardiovascular Disease

## 2023-01-23 DIAGNOSIS — Z1231 Encounter for screening mammogram for malignant neoplasm of breast: Secondary | ICD-10-CM

## 2023-01-23 NOTE — Telephone Encounter (Signed)
  Pt is calling regarding lab result. She would like to speak with a nurse to discuss about the new medication Dr.Kelly recommended. She said, she has an appt at 2 pm and to call her after 2:30 pm

## 2023-01-23 NOTE — Telephone Encounter (Signed)
Called pt she states she 4 years ago she had a scan, she has been on Crestor 20 mg for years.  She states she does not want to increase her statin. "I've been taking 20 mg for years and it has been working for me. I just have a feelings if I increase it it will be an issue. He started my husband on another medication to help absorb cholesterol. My LDL is lower but my good cholesterol is really high but I heard that is not good either. If I need to come in or if he just wants to make a call I'll do what he thinks."

## 2023-02-13 NOTE — Telephone Encounter (Signed)
Called pt to speak about her husband. After speaking about him we spoke about her. She states he added Zetia to his medication regimen. "I think I want to try the medication. I would also like to have an appointment to discuss this with Dr. Tresa Endo." This nurse looked over Dr. Landry Dyke schedule, I did not see an open slot. Will get message to Dr. Tresa Endo and his nurse for review.

## 2023-02-13 NOTE — Telephone Encounter (Signed)
Pt is calling back to discuss medication options with Dr. Tresa Endo or his RN.

## 2023-02-16 NOTE — Telephone Encounter (Signed)
LVM to call back to discuss.   Left call back number.   

## 2023-02-22 ENCOUNTER — Encounter: Payer: Self-pay | Admitting: Cardiovascular Disease

## 2023-02-22 NOTE — Telephone Encounter (Signed)
Patient returned call

## 2023-02-22 NOTE — Telephone Encounter (Signed)
Spoke to the patient, she mentioned no one called and left her VM message. Explained MD and nurse are currently not in the office. Patient voiced understanding. Will forward to MD and nurse.

## 2023-02-23 NOTE — Telephone Encounter (Signed)
See mychart messages, I will review with Dr.Kelly when he returns.   Thanks!

## 2023-03-07 ENCOUNTER — Other Ambulatory Visit: Payer: Self-pay

## 2023-03-07 ENCOUNTER — Telehealth: Payer: Self-pay | Admitting: Cardiovascular Disease

## 2023-03-07 MED ORDER — EZETIMIBE 10 MG PO TABS: 10.0000 mg | ORAL_TABLET | Freq: Every day | ORAL | 3 refills | Status: DC

## 2023-03-07 NOTE — Telephone Encounter (Signed)
Called to sch an appt with Dr Tresa Endo, Pt is upset and frustrated and is requesting a call back as soon as possible about her LPA test results.  She stated her and husband has had this test done and have not heard from anyone about what to do.  She does not understand what meds she should be taking and what she needs to do.    Best number [phone] OK to leave message is she does not answer

## 2023-03-07 NOTE — Telephone Encounter (Signed)
Spoke with patient, answered questions in regard to the LIPID, ordered Zetia 10 mg - sent to pharmacy.  Per last OV possible zetia, patient would like to try this.   Will notify MD.

## 2023-03-21 ENCOUNTER — Ambulatory Visit (INDEPENDENT_AMBULATORY_CARE_PROVIDER_SITE_OTHER): Payer: 59 | Admitting: Obstetrics & Gynecology

## 2023-03-21 ENCOUNTER — Encounter: Payer: Self-pay | Admitting: Obstetrics & Gynecology

## 2023-03-21 ENCOUNTER — Other Ambulatory Visit (HOSPITAL_COMMUNITY)
Admission: RE | Admit: 2023-03-21 | Discharge: 2023-03-21 | Disposition: A | Discharge status: Home / Self Care | Payer: 59 | Source: Ambulatory Visit | Attending: Obstetrics & Gynecology | Admitting: Obstetrics & Gynecology

## 2023-03-21 VITALS — BP 124/80 | HR 64 | Ht 61.5 in | Wt 115.0 lb

## 2023-03-21 DIAGNOSIS — N951 Menopausal and female climacteric states: Secondary | ICD-10-CM

## 2023-03-21 DIAGNOSIS — Z01419 Encounter for gynecological examination (general) (routine) without abnormal findings: Secondary | ICD-10-CM | POA: Insufficient documentation

## 2023-03-21 DIAGNOSIS — Z30431 Encounter for routine checking of intrauterine contraceptive device: Secondary | ICD-10-CM | POA: Diagnosis not present

## 2023-03-21 NOTE — Progress Notes (Signed)
FABIAN ALMENDINGER 1970-11-17 [id number]   History:    52 y.o. G2P2L2 Married.  Sons 20 and 74 yo.  They will graduate from [employer] next year.   RP:  Established patient presenting for annual gyn exam    HPI: Well on Mirena IUD since July 25, 2018.  No breakthrough bleeding. Occasional hot flushes and night sweats. No pelvic pain.  No pain with intercourse.  Pap Neg 02/2021. Pap reflex today. Urine and bowel movements normal.  Colono 03/2020. Breasts normal. Mammo Neg 01/2023.  Body mass index 21.38. Physically active, walking everyday.  Health labs all normal with family physician.  Followed by Cardio.    Past medical history,surgical history, family history and social history were all reviewed and documented in the EPIC chart.  Gynecologic History No LMP recorded. (Menstrual status: IUD).  Obstetric History OB History  Gravida Para Term Preterm AB Living  2 2 1 1  0 2  SAB IAB Ectopic Multiple Live Births          2    # Outcome Date GA Lbr Len/2nd Weight Sex Delivery Anes PTL Lv  2 Preterm           1 Term              ROS: A ROS was performed and pertinent positives and negatives are included in the history. GENERAL: No fevers or chills. HEENT: No change in vision, no earache, sore throat or sinus congestion. NECK: No pain or stiffness. CARDIOVASCULAR: No chest pain or pressure. No palpitations. PULMONARY: No shortness of breath, cough or wheeze. GASTROINTESTINAL: No abdominal pain, nausea, vomiting or diarrhea, melena or bright red blood per rectum. GENITOURINARY: No urinary frequency, urgency, hesitancy or dysuria. MUSCULOSKELETAL: No joint or muscle pain, no back pain, no recent trauma. DERMATOLOGIC: No rash, no itching, no lesions. ENDOCRINE: No polyuria, polydipsia, no heat or cold intolerance. No recent change in weight. HEMATOLOGICAL: No anemia or easy bruising or bleeding. NEUROLOGIC: No headache, seizures, numbness, tingling or weakness. PSYCHIATRIC: No  depression, no loss of interest in normal activity or change in sleep pattern.     Exam:   BP 124/80   Pulse 64   Ht 5' 1.5" (1.562 m)   Wt 115 lb (52.2 kg)   SpO2 99%   BMI 21.38 kg/m   Body mass index is 21.38 kg/m.  General appearance : Well developed well nourished female. No acute distress HEENT: Eyes: no retinal hemorrhage or exudates,  Neck supple, trachea midline, no carotid bruits, no thyroidmegaly Lungs: Clear to auscultation, no rhonchi or wheezes, or rib retractions  Heart: Regular rate and rhythm, no murmurs or gallops Breast:Examined in sitting and supine position were symmetrical in appearance, no palpable masses or tenderness,  no skin retraction, no nipple inversion, no nipple discharge, no skin discoloration, no axillary or supraclavicular lymphadenopathy Abdomen: no palpable masses or tenderness, no rebound or guarding Extremities: no edema or skin discoloration or tenderness  Pelvic: Vulva: Normal             Vagina: No gross lesions or discharge  Cervix: No gross lesions or discharge.  IUD strings visible at [facility].  Pap reflex done.  Uterus  AV, normal size, shape and consistency, non-tender and mobile  Adnexa  Without masses or tenderness  Anus: Normal   Assessment/Plan:  52 y.o. female for annual exam   1. Encounter for routine gynecological examination with Papanicolaou smear of cervix Well on Mirena IUD since July 25, 2018.  No breakthrough bleeding. Occasional hot flushes and night sweats. No pelvic pain. No pain with intercourse.  Pap Neg 02/2021. Pap reflex today. Urine and bowel movements normal.  Colono 03/2020. Breasts normal. Mammo Neg 01/2023.  Body mass index 21.38. Physically active, walking everyday.  Health labs all normal with family physician. Followed by Cardio. - Cytology - PAP( [facility])  2. Encounter for routine checking of intrauterine contraceptive device (IUD) Well on Mirena IUD since July 25, 2018.  No breakthrough  bleeding. IUD in good position.  3. Perimenopause Check menopausal status with [facility] today. - [facility]   Genia Del MD, 3:45 PM

## 2023-03-22 LAB — FOLLICLE STIMULATING HORMONE: FSH: 103.4 m[IU]/mL

## 2023-03-23 LAB — CYTOLOGY - PAP: Diagnosis: NEGATIVE

## 2023-03-27 ENCOUNTER — Encounter: Payer: Self-pay | Admitting: Obstetrics & Gynecology

## 2023-07-11 ENCOUNTER — Telehealth: Payer: Self-pay | Admitting: Cardiovascular Disease

## 2023-07-11 ENCOUNTER — Telehealth: Payer: Self-pay

## 2023-07-11 DIAGNOSIS — R931 Abnormal findings on diagnostic imaging of heart and coronary circulation: Secondary | ICD-10-CM

## 2023-07-11 DIAGNOSIS — E785 Hyperlipidemia, unspecified: Secondary | ICD-10-CM

## 2023-07-11 NOTE — Telephone Encounter (Signed)
Calling to see get lab orders, so that she can have them done before appt. Please advise

## 2023-07-11 NOTE — Telephone Encounter (Signed)
CMET and lipid profile are ordered already. Come when able, fasting.

## 2023-07-11 NOTE — Telephone Encounter (Signed)
Left message on patient's voicemail informing her that "CMET and lipid profile are ordered already. Come when able, fasting."

## 2023-07-11 NOTE — Telephone Encounter (Signed)
Message from wife on her husbands chart.  She needs Labs prior to appt. As well as him.  Labs ordered on her chart as well and she is aware they will go to [facility] near home to have them done.

## 2023-07-24 LAB — COMPREHENSIVE METABOLIC PANEL
ALT: 28 IU/L (ref 0–32)
AST: 30 IU/L (ref 0–40)
Albumin: 4.6 g/dL (ref 3.8–4.9)
Alkaline Phosphatase: 60 IU/L (ref 44–121)
BUN/Creatinine Ratio: 15 (ref 9–23)
BUN: 12 mg/dL (ref 6–24)
Bilirubin Total: 0.4 mg/dL (ref 0.0–1.2)
CO2: 25 mmol/L (ref 20–29)
Calcium: 9.6 mg/dL (ref 8.7–10.2)
Chloride: 103 mmol/L (ref 96–106)
Creatinine, Ser: 0.79 mg/dL (ref 0.57–1.00)
Globulin, Total: 2.3 g/dL (ref 1.5–4.5)
Glucose: 89 mg/dL (ref 70–99)
Potassium: 4.4 mmol/L (ref 3.5–5.2)
Sodium: 141 mmol/L (ref 134–144)
Total Protein: 6.9 g/dL (ref 6.0–8.5)
eGFR: 89 mL/min/{1.73_m2} (ref >59)

## 2023-07-24 LAB — LIPID PANEL
Chol/HDL Ratio: 1.8 ratio (ref 0.0–4.4)
Cholesterol, Total: 171 mg/dL (ref 100–199)
HDL: 96 mg/dL (ref >39)
LDL Chol Calc (NIH): 59 mg/dL (ref 0–99)
Triglycerides: 92 mg/dL (ref 0–149)
VLDL Cholesterol Cal: 16 mg/dL (ref 5–40)

## 2023-08-06 ENCOUNTER — Encounter: Payer: Self-pay | Admitting: Cardiovascular Disease

## 2023-08-06 ENCOUNTER — Ambulatory Visit: Payer: 59 | Attending: Cardiovascular Disease | Admitting: Cardiovascular Disease

## 2023-08-06 DIAGNOSIS — R931 Abnormal findings on diagnostic imaging of heart and coronary circulation: Secondary | ICD-10-CM | POA: Diagnosis not present

## 2023-08-06 DIAGNOSIS — R002 Palpitations: Secondary | ICD-10-CM

## 2023-08-06 DIAGNOSIS — I2510 Atherosclerotic heart disease of native coronary artery without angina pectoris: Secondary | ICD-10-CM

## 2023-08-06 DIAGNOSIS — I340 Nonrheumatic mitral (valve) insufficiency: Secondary | ICD-10-CM | POA: Diagnosis not present

## 2023-08-06 DIAGNOSIS — E7841 Elevated Lipoprotein(a): Secondary | ICD-10-CM

## 2023-08-06 DIAGNOSIS — E785 Hyperlipidemia, unspecified: Secondary | ICD-10-CM

## 2023-08-06 DIAGNOSIS — K219 Gastro-esophageal reflux disease without esophagitis: Secondary | ICD-10-CM

## 2023-08-06 MED ORDER — METOPROLOL TARTRATE 50 MG PO TABS
50.0000 mg | ORAL_TABLET | Freq: Once | ORAL | 0 refills | Status: DC
Start: 2023-08-06 — End: 2023-11-14

## 2023-08-06 NOTE — Patient Instructions (Signed)
Medication Instructions:  START ASPIRIN 81. TAKE ONE TABLET DAILY.  TAKE THE ROSUVASTATIN 20MG . TAKE 40MG  (2) TABLETS WITH ALTERNATING TO ONE TABLET EVERY OTHER DAY.   *If you need a refill on your cardiac medications before your next appointment, please call your pharmacy*   Lab Work: RETURN FOR FASTING LABS  If you have labs (blood work) drawn today and your tests are completely normal, you will receive your results only by: MyChart Message (if you have MyChart) OR A paper copy in the mail If you have any lab test that is abnormal or we need to change your treatment, we will call you to review the results.   Testing/Procedures:   Your cardiac CT will be scheduled at one of the below locations:   [facility] [address] [place], [place] [address] [phone]  OR  [facility] [address] [place], [place] [address] [phone]  OR   [facility] [address] [place], [place] [address] [phone]  If scheduled at [facility], please arrive at the [facility] and Children's Entrance (Entrance C2) of [facility] 30 minutes prior to test start time. You can use the FREE valet parking offered at entrance C (encouraged to control the heart rate for the test)  Proceed to the [facility] Radiology Department (first floor) to check-in and test prep.  All radiology patients and guests should use entrance C2 at [facility], accessed from [facility], even though the hospital's physical address listed is [address]    If scheduled at [facility] or [facility], please arrive 15 mins early for check-in and test prep.  There is spacious parking and easy access to the radiology department from the [facility] entrance. Please enter here and check-in with the desk attendant.   Please  follow these instructions carefully (unless otherwise directed):  An IV will be required for this test and Nitroglycerin will be given.  Hold all erectile dysfunction medications at least 3 days (72 hrs) prior to test. (Ie viagra, cialis, sildenafil, tadalafil, etc)   On the Night Before the Test: Be sure to Drink plenty of water. Do not consume any caffeinated/decaffeinated beverages or chocolate 12 hours prior to your test. Do not take any antihistamines 12 hours prior to your test. If the patient has contrast allergy: Patient will need a prescription for Prednisone and very clear instructions (as follows): Prednisone 50 mg - take 13 hours prior to test Take another Prednisone 50 mg 7 hours prior to test Take another Prednisone 50 mg 1 hour prior to test Take Benadryl 50 mg 1 hour prior to test Patient must complete all four doses of above prophylactic medications. Patient will need a ride after test due to Benadryl.  On the Day of the Test: Drink plenty of water until 1 hour prior to the test. Do not eat any food 1 hour prior to test. You may take your regular medications prior to the test.  Take metoprolol (Lopressor) two hours prior to test. If you take Furosemide/Hydrochlorothiazide/Spironolactone, please HOLD on the morning of the test. FEMALES- please wear underwire-free bra if available, avoid dresses & tight clothing        After the Test: Drink plenty of water. After receiving IV contrast, you may experience a mild flushed feeling. This is normal. On occasion, you may experience a mild rash up to 24  hours after the test. This is not dangerous. If this occurs, you can take Benadryl 25 mg and increase your fluid intake. If you experience trouble breathing, this can be serious. If it is severe call 911 IMMEDIATELY. If it is mild, please call our office. If you take any of these medications: Glipizide/Metformin, Avandament, Glucavance, please do not take 48 hours after  completing test unless otherwise instructed.  We will call to schedule your test 2-4 weeks out understanding that some insurance companies will need an authorization prior to the service being performed.   For more information and frequently asked questions, please visit our website : [email or web address]  For non-scheduling related questions, please contact the cardiac imaging nurse navigator should you have any questions/concerns: Cardiac Imaging Nurse Navigators Direct Office Dial: [phone]   For scheduling needs, including cancellations and rescheduling, please call [place], [phone].    Follow-Up: At [facility], you and your health needs are our priority.  As part of our continuing mission to provide you with exceptional heart care, we have created designated Provider Care Teams.  These Care Teams include your primary Cardiologist (physician) and Advanced Practice Providers (APPs -  Physician Assistants and Nurse Practitioners) who all work together to provide you with the care you need, when you need it.  We recommend signing up for the patient portal called "MyChart".  Sign up information is provided on this After Visit Summary.  MyChart is used to connect with patients for Virtual Visits (Telemedicine).  Patients are able to view lab/test results, encounter notes, upcoming appointments, etc.  Non-urgent messages can be sent to your provider as well.   To learn more about what you can do with MyChart, go to [email or web address].    Your next appointment:   6 month(s)  DR. Nicki Guadalajara  Provider:   None

## 2023-08-06 NOTE — Progress Notes (Unsigned)
Patient ID: Natalie Hooper, female   DOB: [birth year], 52 y.o.   MRN: [id number]       Primary M.D.: Dr. Lockie Pares  HPI: Natalie Hooper is a 52 y.o. female who presents for an 72 month follow-up cardiology evaluation. She is the daughter of Natalie Hooper.  Natalie Hooper has a history of palpitations.  An echo Doppler study November 2012 showed normal systolic and diastolic function. She had flat mitral valve coaptation without definitive prolapse and mild mitral regurgitation with trivial tricuspid regurgitation. She had normal pulmonary pressures.  She has been on metoprolol succinate at 37.5 mg daily which has essentially controlled her palpitations. There is a very rare instance where she does note an occasional palpitation and she has taken an extra half of this metoprolol.  This usually occurs when she is resting under periods of increased stress.    I last saw her in October 2018.  At that time, she was doing well.  Palpitations were controlled with metoprolol.  She was sizing daily and remained active.    When I saw her in December 2019 she remained active and was often walking several miles and exercising daily.  She denied chest tightness or pressure.  She often walks several miles and exercises daily.  She denies any episodes of chest tightness or pressure.  She had undergone laboratory by her primary physician, Dr. Burnett Hooper and total cholesterol was 210, triglycerides 77, HDL 72, and LDL 122.  She is not on any therapy for lipids.  She has a strong family history for CAD in her mother who had significant multivessel CAD and required CABG revascularization.  She has rare episodes of GERD for which she takes omeprazole as needed.    She was concerned about underlying coronary artery disease and I recommended a screening coronary calcium score which was done in January 2020.  This demonstrated dense calcification in the proximal to mid LAD and her calcium score was elevated at 122,  representing 99th percentile based on age and sex matched controls.  At that time, she also began to notice a vague sensation of chest fullness. Despite multiple attempts she has been unable to be approved for coronary CTA with FFR.  Options for other functional studies have been discussed but have been limited these because of the COVID pandemic. She saw Natalie Hooper, [facility] on 05/15/2019.  She ultimately underwent a routine grade exercise treadmill study since no other evaluations were approved by her insurance.  On the exercise treadmill test, she was able to exercise to a 12.9-met workload and 10 minutes and 50 seconds of exercise.  Peak heart rate was 171, representing 87% of predicted maximum.  She developed a vague chest sensation at stress but this was unassociated with ECG changes of ischemia.   With her abnormal cardiac calcium score, I recommended initiation of statin therapy and since February has been on rosuvastatin 20 mg daily which she has tolerated well.  Previously she had been hesitant to initiate statin therapy.  I have discussed with her the importance of achieving an LDL less than 70 in an attempt to potentially induce plaque stability and with ultimate regression.  Recently she also had noticed some vague palpitations which have improved with further titration of Toprol-XL to 50 mg daily.     I evaluated her in a telemedicine visit on July 01, 2019.  Lipid studies in June 2020 by Dr. Burnett Hooper showed improvement with initiation of statin therapy and total cholesterol was  177, triglycerides 118, HDL 83, and LDL 70.    I saw her in December 2020.  Over the previous several months she continued to do well.  She denied any recent chest pain.  Her palpitations have stabilized.  She underwent repeat laboratory on September 30, 2019.  Lipid studies were further improved with total cholesterol 156, and LDL cholesterol was now 58.  She denied any myalgias or arthralgias.  She denied palpitations or  chest pain.    I saw her on October 07, 2020 at which time she remained stable.  She was continuing to exercise regularly.  She denied any chest pain, PND orthopnea.  She continues to be on Toprol-XL 37.5 mg daily and does not have any palpitations.  She is tolerating rosuvastatin 20 mg for hyperlipidemia for potential plaque regression.  Laboratory from Dr. Burnett Hooper on July 06, 2020 showed a total cholesterol 167, triglycerides 150, HDL 79.7, and LDL 57.  She had normal chemistry studies, LFTs, and hemoglobin/hematocrit was 13.1/39.3.    Since I last saw her, she has continued to do well.  Her to children are both at [facility].  She continues to exercise regularly.  She sees Dr. Burnett Hooper for primary care and recently saw him on September 25, 2022.  She underwent laboratory on September 19, 2022 which showed hemoglobin 12.8 hematocrit 37.9.  Lipid studies revealed total cholesterol 176, triglycerides 112, HDL 95 and LDL 58.  Chemistry was normal.  LFTs were normal.  She continues to be on metoprolol succinate 37.5 mg daily which controls palpitations and she is on rosuvastatin 20 mg daily which she is tolerating.  She takes omeprazole as needed for GERD.    Since I last saw her, she underwent an LP(a) laboratory in December 26, 2022.  This was significantly elevated at 196.,  She remains relatively asymptomatic with exercise.  At times she has noticed some vague of both arms which often is nonexertional.  The past several months she has noted some intermittent right nostril nosebleeds.  She is continue to be followed by Dr. Burnett Hooper for primary care.  Most recent laboratory on July 24, 2023 showed normal renal function with creatinine 0.79.  Cholesterol was 171, triglycerides 92, HDL 96, VLDL 16, and LDL cholesterol was now 59 on rosuvastatin she was taking 20 mg and Zetia 10 mg.  She continues to be on metoprolol succinate 37.5 mg daily.  She is on omeprazole.  She presents for evaluation.   Past Medical  History:  Diagnosis Date   Arrhythmia    Hx of Palp.   Hypertension    Mitral regurgitation    Palpitations    Unilateral inguinal hernia     Past Surgical History:  Procedure Laterality Date   COLONOSCOPY WITH PROPOFOL N/A 03/18/2020   Procedure: COLONOSCOPY WITH PROPOFOL;  Surgeon: Toledo, Boykin Nearing, MD;  Location: [facility] ENDOSCOPY;  Service: Gastroenterology;  Laterality: N/A;   DILATION AND CURETTAGE OF UTERUS     DOBUTAMINE STRESS ECHO  05/22/2011   Normal treadmill,normal stress echo   DOPPLER ECHOCARDIOGRAPHY  10/03/2011   EF = >55%,mild mitral regurg   ESOPHAGOGASTRODUODENOSCOPY (EGD) WITH PROPOFOL N/A 03/18/2020   Procedure: ESOPHAGOGASTRODUODENOSCOPY (EGD) WITH PROPOFOL;  Surgeon: Toledo, Boykin Nearing, MD;  Location: [facility] ENDOSCOPY;  Service: Gastroenterology;  Laterality: N/A;   Event Monitor  06/24/2008   NSR,Sinus Tach   INTRAUTERINE DEVICE (IUD) INSERTION     mirena inserted 07-25-18   MYOMECTOMY  2011   TONSILLECTOMY     age  6    No Known Allergies  Current Outpatient Medications  Medication Sig Dispense Refill   acetaminophen (TYLENOL) 325 MG tablet Take 650 mg by mouth as needed.     ibuprofen (ADVIL,MOTRIN) 200 MG tablet Take 200 mg by mouth as needed.     levonorgestrel (MIRENA) 20 MCG/24HR IUD 1 each by Intrauterine route once.     metoprolol succinate (TOPROL-XL) 25 MG 24 hr tablet TAKE 1.5 TABLETS BY MOUTH DAILY. 135 tablet 3   metoprolol tartrate (LOPRESSOR) 50 MG tablet Take 1 tablet (50 mg total) by mouth once for 1 dose. PLEASE TAKE METOPROLOL 2  HOURS PRIOR TO CTA SCAN. 1 tablet 0   omeprazole (PRILOSEC) 20 MG capsule Take 1 capsule by mouth as needed. Pt takes 1 tablet daily     ezetimibe (ZETIA) 10 MG tablet Take 1 tablet (10 mg total) by mouth daily. 90 tablet 3   rosuvastatin (CRESTOR) 20 MG tablet TAKE 1 TABLET (20 MG TOTAL) BY MOUTH DAILY. 90 tablet 3   No current facility-administered medications for this visit.    Social History    Socioeconomic History   Marital status: Married    Spouse name: Not on file   Number of children: Not on file   Years of education: Not on file   Highest education level: Not on file  Occupational History   Not on file  Tobacco Use   Smoking status: Former    Current packs/day: 0.00    Types: Cigarettes    Start date: 12/11/1991    Quit date: 12/10/1996    Years since quitting: 26.6   Smokeless tobacco: Never  Vaping Use   Vaping status: Never Used  Substance and Sexual Activity   Alcohol use: Yes    Comment: social beer and wine   Drug use: No   Sexual activity: Yes    Partners: Male    Birth control/protection: I.U.D.    Comment: intercourse age 2, less than 5 sexual partners,des neg  Other Topics Concern   Not on file  Social History Narrative   Not on file   Social Determinants of Health   Financial Resource Strain: Patient Declined (09/25/2022)   Received from [facility], [facility]   Overall Financial Resource Strain (CARDIA)    Difficulty of Paying Living Expenses: Patient declined  Food Insecurity: No Food Insecurity (09/25/2022)   Received from [facility], [facility]   Hunger Vital Sign    Worried About Running Out of Food in the Last Year: Never true    Ran Out of Food in the Last Year: Never true  Transportation Needs: No Transportation Needs (09/25/2022)   Received from [facility], [facility]   [facility] - Transportation    In the past 12 months, has lack of transportation kept you from medical appointments or from getting medications?: No    Lack of Transportation (Non-Medical): No  Physical Activity: Not on file  Stress: Not on file  Social Connections: Not on file  Intimate Partner Violence: Not on file   Socially she is married and has 2 children, with her oldest now a senior at [employer] and her youngest also at [employer].  There is no  tobacco use. She does drink occasional alcohol.  Family History  Problem Relation Age of Onset   Heart disease Mother 93       triple bypass   Hypertension Mother  Lung cancer Mother    Leukemia Mother    Other Mother        CLL   Diabetes Maternal Aunt    Colon cancer Maternal Uncle    Heart disease Maternal Uncle    Hypertension Maternal Grandmother    Heart disease Maternal Grandmother    Breast cancer Maternal Grandmother    Hypertension Maternal Grandfather    Diabetes Maternal Grandfather    Heart disease Maternal Grandfather 55   ROS General: Negative; No fevers, chills, or night sweats;  HEENT: Positive for rare nosebleeds; No changes in vision or hearing, sinus congestion, difficulty swallowing Pulmonary: Negative; No cough, wheezing, shortness of breath, hemoptysis Cardiovascular: Negative; No chest pain, presyncope, syncope, palpitations GI: Positive for mild GERD; No nausea, vomiting, diarrhea, or abdominal pain GU: Negative; No dysuria, hematuria, or difficulty voiding Musculoskeletal: Negative; no myalgias, joint pain, or weakness Hematologic/Oncology: Negative; no easy bruising, bleeding Endocrine: Negative; no heat/cold intolerance; no diabetes Neuro: Negative; no changes in balance, headaches Skin: Negative; No rashes or skin lesions Psychiatric: Negative; No behavioral problems, depression Sleep: Negative; No snoring, daytime sleepiness, hypersomnolence, bruxism, restless legs, hypnogognic hallucinations, no cataplexy Other comprehensive 14 point system review is negative.   PE BP 114/82   Pulse 64   Ht 5\' 2"  (1.575 m)   Wt 114 lb 3.2 oz (51.8 kg)   SpO2 98%   BMI 20.89 kg/m    Repeat blood pressure by me was 118/80  Wt Readings from Last 3 Encounters:  08/06/23 114 lb 3.2 oz (51.8 kg)  03/21/23 115 lb (52.2 kg)  10/04/22 121 lb 9.6 oz (55.2 kg)   General: Alert, oriented, no distress.  Skin: normal turgor, no rashes, warm and dry HEENT:  Normocephalic, atraumatic. Pupils equal round and reactive to light; sclera anicteric; extraocular muscles intact;  Nose without nasal septal hypertrophy Mouth/Parynx benign; Mallinpatti scale 3 Neck: No JVD, no carotid bruits; normal carotid upstroke Lungs: clear to ausculatation and percussion; no wheezing or rales Chest wall: without tenderness to palpitation Heart: PMI not displaced, RRR, s1 s2 normal, 1/6 systolic murmur, no diastolic murmur, no rubs, gallops, thrills, or heaves Abdomen: soft, nontender; no hepatosplenomehaly, BS+; abdominal aorta nontender and not dilated by palpation. Back: no CVA tenderness Pulses 2+ Musculoskeletal: full range of motion, normal strength, no joint deformities Extremities: no clubbing cyanosis or edema, Homan's sign negative  Neurologic: grossly nonfocal; Cranial nerves grossly wnl Psychologic: Normal mood and affect    EKG Interpretation Date/Time:  Monday August 06 2023 16:10:05 EDT Ventricular Rate:  64 PR Interval:  138 QRS Duration:  92 QT Interval:  412 QTC Calculation: 425 R Axis:   56  Text Interpretation: Normal sinus rhythm Normal ECG No previous ECGs available Confirmed by Natalie Hooper ([id number]) on 08/06/2023 4:53:42 PM     October 04, 2022 ECG (independently read by me): NSR at 65, normal intervals  October 07, 2020 ECG (independently read by me): NSR at 69; no ectopy. Normal intervals  December 2020 ECG (independently read by me): NSR aT 65; NO ECTOPY; NORMAL INTERVALS  December 2019 ECG (independently read by me): Normal sinus rhythm at 63 bpm.  Mild RV conduction delay.  No ectopy.  Normal intervals.  October 2018 ECG (independently read by me): Normal sinus rhythm at 64 bpm.  September 2017 ECG (independently read by me): Normal sinus rhythm at 70 bpm.  Mild RV conduction delay.  Normal intervals.  February 2016 ECG (independently read by me): Normal sinus rhythm at 65  bpm.  No ectopy.  Normal intervals.  Mild RV  conduction delay.  February 2015 ECG (independently read by me): Normal sinus rhythm at 68 beats per minute. Normal intervals. No ectopy  LABS:    Latest Ref Rng & Units 07/24/2023    9:28 AM 09/30/2019    9:21 AM 07/06/2016    8:03 AM  BMP  Glucose 70 - 99 mg/dL 89  87  93   BUN 6 - 24 mg/dL 12  13  15    Creatinine 0.57 - 1.00 mg/dL 4.09  8.11  9.14   BUN/Creat Ratio 9 - 23 15  17  20    Sodium 134 - 144 mmol/L 141  138  140   Potassium 3.5 - 5.2 mmol/L 4.4  3.9  5.0   Chloride 96 - 106 mmol/L 103  102  102   CO2 20 - 29 mmol/L 25  25  23    Calcium 8.7 - 10.2 mg/dL 9.6  9.2  9.5       Latest Ref Rng & Units 07/24/2023    9:28 AM 09/30/2019    9:21 AM 07/06/2016    8:03 AM  Hepatic Function  Total Protein 6.0 - 8.5 g/dL 6.9  6.3  6.8   Albumin 3.8 - 4.9 g/dL 4.6  4.3  4.5   AST 0 - 40 IU/L 30  16  20    ALT 0 - 32 IU/L 28  11  12    Alk Phosphatase 44 - 121 IU/L 60  47  45   Total Bilirubin 0.0 - 1.2 mg/dL 0.4  0.4  0.4       Latest Ref Rng & Units 07/06/2016    8:03 AM 07/12/2010    9:05 AM  CBC  WBC 3.4 - 10.8 x10E3/uL 6.3  8.6   Hemoglobin 11.1 - 15.9 g/dL 78.2  95.6   Hematocrit 34.0 - 46.6 % 38.9  36.6   Platelets 150 - 379 x10E3/uL 278  318    Lab Results  Component Value Date   MCV 91 07/06/2016   MCV 92.5 07/12/2010   Lab Results  Component Value Date   TSH 2.590 07/06/2016   No results found for: "HGBA1C"  Lipid Panel     Component Value Date/Time   CHOL 171 07/24/2023 0928   TRIG 92 07/24/2023 0928   HDL 96 07/24/2023 0928   CHOLHDL 1.8 07/24/2023 0928   LDLCALC 59 07/24/2023 0928    RADIOLOGY: No results found.  CARDIAC STUDIES  CT CARDIAC SCORING : November 12, 2018. ADDENDUM REPORT: 11/13/2018 13:15   CLINICAL DATA:  Risk stratification   EXAM: Coronary Calcium Score   TECHNIQUE: The patient was scanned on a Siemens Somatom 64 slice scanner. Axial non-contrast 3 mm slices were carried out through the heart. The data set was analyzed on  a dedicated work station and scored using the Agatson method.   FINDINGS: Non-cardiac: See separate report from [facility].   Ascending aorta: Normal diameter 2.9 cm   Pericardium: Normal   Coronary arteries: Dense calcium noted in proximal and mid LAD   IMPRESSION: Coronary calcium score of 122. This was 103 th percentile for age and sex matched control.   Exercise tolerance test May 27, 2019:  Study Highlights  Blood pressure and heart rate demonstrated a normal response to exercise. There was no ST segment deviation noted during stress. Patient had good exercise capacity, exercising for 10 minutes Study stopped for fatigue and atypical chest pain.  IMPRESSION:  1. Coronary artery disease involving native coronary artery of native heart without angina pectoris   2. Elevated coronary artery calcium score: January 2020 at 122; 99th percentile.   3. Nonrheumatic mitral valve regurgitation   4. Palpitations   5. Hyperlipidemia with target LDL less than 50   6. Elevated Lp(a)   7. Gastroesophageal reflux disease without esophagitis     ASSESSMENT AND PLAN: Natalie Hooper is a very pleasant 52 year-old female who has documented normal systolic and diastolic function with flat mitral valve closure and mild mitral regurgitation by an echo Doppler study in 2012. She was not found to have definitive mitral valve prolapse. She has an intermittent click on exam and a systolic murmur suggestive of MR.  Due to her significant family history for CAD, a CT cardiac score was done which revealed an elevated calcium score at 122 which was 99th percentile for age and sex matched control.  Subsequent exercise treadmill testing demonstrated good exercise tolerance without chest pain development or ECG abnormalities.  Based on her coronary calcification and subclinical atherosclerosis she has been treated with rosuvastatin 20 mg.  Subsequent lipid studies have been excellent with LDLs  typically in the mid to upper 50s.  Her most recent laboratory from September 19, 2022 was reviewed which showed total cholesterol at 176, triglycerides 112, excellent HDL at 95, and LDL at 58.  She does not have any symptoms of chest pain or exertional dyspnea.  She is tolerating metoprolol succinate 37.5 mg daily.  Her last office visit I recommended the addition of Zetia 10 mg which she has initiated.  Her diet may have been somewhat altered this summer.  Her most recent lipid studies showed total cholesterol 171 triglycerides 92 HDL 96 and LDL has not significantly changed and currently is at 59.  I reviewed her LP(a) value which is significantly elevated at 196.  I discussed the potential for increased vulnerable plaque associated with significant LP(a) elevation.  I have suggested slight adjustment in her rosuvastatin dose and she will take 40 mg alternating with 20 mg every other day and continue Zetia.  She is concerned about her coronary calcification.  At times she has noted some vague nonexertional sensation.  I have ended we try scheduling her for coronary CTA for further evaluation of luminal stenosis.  With her LP(a) elevation I have suggested reinitiation of baby aspirin 81 mg.  If she does experience increased nosebleeds this may need to be discontinued.  In 3 to 4 months I have recommended a repeat comprehensive metabolic panel and fasting lipid panel.  She continues to take omeprazole as needed for GERD.  I will see her in 6 to 7 months for cardiology reassessment.   Lennette Bihari, MD, [facility]  08/08/2023 6:10 PM

## 2023-08-08 ENCOUNTER — Encounter: Payer: Self-pay | Admitting: Cardiovascular Disease

## 2023-08-14 ENCOUNTER — Other Ambulatory Visit (HOSPITAL_COMMUNITY): Payer: 59

## 2023-08-20 ENCOUNTER — Encounter (HOSPITAL_COMMUNITY): Payer: Self-pay

## 2023-08-22 ENCOUNTER — Ambulatory Visit (HOSPITAL_COMMUNITY): Admission: RE | Admit: 2023-08-22 | Payer: 59 | Source: Ambulatory Visit

## 2023-09-03 ENCOUNTER — Ambulatory Visit (HOSPITAL_COMMUNITY)
Admission: RE | Admit: 2023-09-03 | Discharge: 2023-09-03 | Disposition: A | Discharge status: Home / Self Care | Payer: 59 | Source: Ambulatory Visit | Attending: Cardiovascular Disease | Admitting: Cardiovascular Disease

## 2023-09-03 DIAGNOSIS — R931 Abnormal findings on diagnostic imaging of heart and coronary circulation: Secondary | ICD-10-CM | POA: Diagnosis present

## 2023-09-03 DIAGNOSIS — I340 Nonrheumatic mitral (valve) insufficiency: Secondary | ICD-10-CM

## 2023-09-03 MED ORDER — DIPHENHYDRAMINE HCL 50 MG/ML IJ SOLN
INTRAMUSCULAR | Status: AC
  Filled 2023-09-03: qty 1

## 2023-09-03 MED ORDER — DIPHENHYDRAMINE HCL 50 MG/ML IJ SOLN
25.0000 mg | Freq: Once | INTRAMUSCULAR | Status: AC
  Administered 2023-09-03: 25 mg via INTRAVENOUS

## 2023-09-03 MED ORDER — NITROGLYCERIN 0.4 MG SL SUBL
SUBLINGUAL_TABLET | SUBLINGUAL | Status: AC
  Filled 2023-09-03: qty 2

## 2023-09-03 MED ORDER — NITROGLYCERIN 0.4 MG SL SUBL
0.8000 mg | SUBLINGUAL_TABLET | Freq: Once | SUBLINGUAL | Status: AC
  Administered 2023-09-03: 0.8 mg via SUBLINGUAL

## 2023-09-03 MED ORDER — IOHEXOL 350 MG/ML SOLN
95.0000 mL | Freq: Once | INTRAVENOUS | Status: AC | PRN
  Administered 2023-09-03: 95 mL via INTRAVENOUS

## 2023-09-03 NOTE — Progress Notes (Signed)
MD Duke Salvia updated on resolution of patient symptoms. MD cleared patient for discharge. As per MD, patient education reinforced regarding s/s of anaphylaxis. Patient and husband confirmed understanding. Patient denies s/s of anaphylaxis. None observed. Patient instructed to take benadryl and go to the emergency room if she experiences any s/s of anaphylaxis. Patient and husband confirmed understanding. Patient confirms having all belongings. IV removed. IV catheter intact. IV site clean dry and intact. Patient able to ambulate without difficulty. Patient escorted to car.

## 2023-09-03 NOTE — Progress Notes (Signed)
Patient states she felt "tightness and numbness" in her throat after recieving IV contrast (s/p CT heart). VSS. Airway and IV patent. Patient denies signs/symptoms of respiratory distress. None observed. Patient educated on s/s of anaphylaxis. Patient confirmed understanding. Patient denies having any other symptoms.  Md Harlan notified. benadryl ordered. Benadryl administered. Patient resting comfortably with husband at bedside. Patient describes throat symptoms as resololved. VSS. Will observe further prior to discharge.

## 2023-09-07 ENCOUNTER — Other Ambulatory Visit (HOSPITAL_COMMUNITY): Payer: Self-pay

## 2023-09-07 DIAGNOSIS — I2510 Atherosclerotic heart disease of native coronary artery without angina pectoris: Secondary | ICD-10-CM

## 2023-09-07 DIAGNOSIS — R931 Abnormal findings on diagnostic imaging of heart and coronary circulation: Secondary | ICD-10-CM

## 2023-10-09 ENCOUNTER — Encounter: Payer: Self-pay | Admitting: Pharmacist

## 2023-10-09 ENCOUNTER — Ambulatory Visit: Payer: 59 | Attending: Cardiology | Admitting: Pharmacist

## 2023-10-09 DIAGNOSIS — E7841 Elevated Lipoprotein(a): Secondary | ICD-10-CM

## 2023-10-09 DIAGNOSIS — Z8249 Family history of ischemic heart disease and other diseases of the circulatory system: Secondary | ICD-10-CM

## 2023-10-09 DIAGNOSIS — R931 Abnormal findings on diagnostic imaging of heart and coronary circulation: Secondary | ICD-10-CM

## 2023-10-09 DIAGNOSIS — I10 Essential (primary) hypertension: Secondary | ICD-10-CM | POA: Insufficient documentation

## 2023-10-09 DIAGNOSIS — I2510 Atherosclerotic heart disease of native coronary artery without angina pectoris: Secondary | ICD-10-CM

## 2023-10-09 DIAGNOSIS — K4090 Unilateral inguinal hernia, without obstruction or gangrene, not specified as recurrent: Secondary | ICD-10-CM | POA: Insufficient documentation

## 2023-10-09 NOTE — Progress Notes (Signed)
Patient ID: Natalie Hooper                 DOB: 1971/10/19                    MRN: [id number]     HPI: Natalie Hooper is a 52 y.o. female patient referred to lipid clinic by Dr Tresa Endo. PMH is significant for CAD, elevated coronary calcium score, family history of CAD, elevated LPA, and HTN.  Patient currently managed on rosuvastatin 20mg  (alternating daily with 40mg ) and ezetimibe with no reported adverse effects. Non smoker. Currently on aspirin 81mg .  Mother had history of CABG in her 62s.   Follows a heart healthy diet and avoids saturated fats and remains physically active.  Labs: LPA 196.0  Current Medications:  Ezetimibe 10mg  daily Rosuvastatin 20 and 40  Risk Factors:  Elevated LPA Elevated coronary calcium score Family history  Imaging: 1. Coronary calcium score of 227. This was 98th percentile for age-,sex, and race-matched controls.   2. Total plaque volume 115 mm3 which is 74th percentile for age- andsex-matched controls (calcified plaque 34 mm3; non-calcified plaque 81 mm3). TPV is mild.   3. Normal coronary origin with right dominance.   4.  There is minimal (<25%) plaque in the LAD and LCX.  CAD-RADS 1.    Past Medical History:  Diagnosis Date   Arrhythmia    Hx of Palp.   Hypertension    Mitral regurgitation    Palpitations    Unilateral inguinal hernia     Current Outpatient Medications on File Prior to Visit  Medication Sig Dispense Refill   acetaminophen (TYLENOL) 325 MG tablet Take 650 mg by mouth as needed.     aspirin EC 81 MG tablet Take 81 mg by mouth.     ezetimibe (ZETIA) 10 MG tablet Take 1 tablet (10 mg total) by mouth daily. 90 tablet 3   ibuprofen (ADVIL,MOTRIN) 200 MG tablet Take 200 mg by mouth as needed.     levonorgestrel (MIRENA) 20 MCG/24HR IUD 1 each by Intrauterine route once.     metoprolol succinate (TOPROL-XL) 25 MG 24 hr tablet TAKE 1.5 TABLETS BY MOUTH DAILY. 135 tablet 3   metoprolol tartrate (LOPRESSOR) 50 MG tablet Take 1  tablet (50 mg total) by mouth once for 1 dose. PLEASE TAKE METOPROLOL 2  HOURS PRIOR TO CTA SCAN. 1 tablet 0   Multiple Vitamin (MULTI-VITAMIN) tablet Take 1 tablet by mouth daily.     omeprazole (PRILOSEC) 20 MG capsule Take 1 capsule by mouth as needed. Pt takes 1 tablet daily     rosuvastatin (CRESTOR) 20 MG tablet TAKE 1 TABLET (20 MG TOTAL) BY MOUTH DAILY. 90 tablet 3   No current facility-administered medications on file prior to visit.    Allergies  Allergen Reactions   Iodinated Contrast Media Anaphylaxis    Patient states feeling "throat tightness and then numbness" after receiving IV contrast for ct coronary.     Assessment/Plan:  1. Hyperlipidemia - Due to family history and other genetic risk factors, patient desires to drive her LDL down further. Advised other options at the moment include PCSK9i or inclisiran but that Repatha/Praluent show the best data for reducing LPA and plaque regression.  Using demo pen, educated patient on mechanism of action, storage, site selection, administration, and possible adverse effects. Patient able to demonstrate in room. Will complete PA and contact patient with response. Recheck lipid panel in 2-3 months.  Continue Zetia 10mg   daily Continue Crestor 20-40mg  daily Start Repatha/Praluent q 14 days Recheck lipid panel in 2-3 weeks  Laural Golden, PharmD, BCACP, CDCES, CPP [address], [address] [place], [place], [address] Phone: [phone], Fax: [phone]

## 2023-10-09 NOTE — Patient Instructions (Signed)
It was nice meeting you today   Please continue your rosuvastatin and ezetimibe for the time being  The medication we discussed today is called Repatha, which is an injection you would take once every 2 weeks  I will complete the prior authorization for you and contact you with the results  Once you start the medication we will recheck your fasting lipid panel in 2-3 months  Please let us know if you have any questions  Laural Golden, PharmD, BCACP, CDCES, CPP [address] [place], [place], [address] Phone: [phone], Fax: [phone]

## 2023-11-14 ENCOUNTER — Other Ambulatory Visit (HOSPITAL_COMMUNITY): Payer: Self-pay

## 2023-11-14 ENCOUNTER — Ambulatory Visit: Payer: 59 | Attending: Cardiovascular Disease | Admitting: Cardiovascular Disease

## 2023-11-14 ENCOUNTER — Encounter: Payer: Self-pay | Admitting: Cardiovascular Disease

## 2023-11-14 ENCOUNTER — Telehealth: Payer: Self-pay

## 2023-11-14 VITALS — BP 104/78 | HR 62 | Ht 62.0 in | Wt 114.6 lb

## 2023-11-14 DIAGNOSIS — E7841 Elevated Lipoprotein(a): Secondary | ICD-10-CM | POA: Diagnosis not present

## 2023-11-14 DIAGNOSIS — I340 Nonrheumatic mitral (valve) insufficiency: Secondary | ICD-10-CM

## 2023-11-14 DIAGNOSIS — I10 Essential (primary) hypertension: Secondary | ICD-10-CM | POA: Diagnosis not present

## 2023-11-14 DIAGNOSIS — E785 Hyperlipidemia, unspecified: Secondary | ICD-10-CM

## 2023-11-14 DIAGNOSIS — I2510 Atherosclerotic heart disease of native coronary artery without angina pectoris: Secondary | ICD-10-CM

## 2023-11-14 DIAGNOSIS — R002 Palpitations: Secondary | ICD-10-CM

## 2023-11-14 NOTE — Patient Instructions (Signed)
*  If you need a refill on your cardiac medications before your next appointment, please call your pharmacy*   Lab Work: Return for fasting labs. CMET, LIPID, LPA If you have labs (blood work) drawn today and your tests are completely normal, you will receive your results only by: MyChart Message (if you have MyChart) OR A paper copy in the mail If you have any lab test that is abnormal or we need to change your treatment, we will call you to review the results.   Follow-Up: At [facility], you and your health needs are our priority.  As part of our continuing mission to provide you with exceptional heart care, we have created designated Provider Care Teams.  These Care Teams include your primary Cardiologist (physician) and Advanced Practice Providers (APPs -  Physician Assistants and Nurse Practitioners) who all work together to provide you with the care you need, when you need it.  We recommend signing up for the patient portal called MyChart.  Sign up information is provided on this After Visit Summary.  MyChart is used to connect with patients for Virtual Visits (Telemedicine).  Patients are able to view lab/test results, encounter notes, upcoming appointments, etc.  Non-urgent messages can be sent to your provider as well.   To learn more about what you can do with MyChart, go to [email or web address].    Your next appointment:   4 month(s)  Provider:   Dr. Debby Sor    Other Instructions Thank you for choosing [facility]!

## 2023-11-14 NOTE — Progress Notes (Signed)
 Patient ID: [name], female   DOB: [birth year], 53 y.o.   MRN: [id number]       Primary M.D.: Dr. Stanton  HPI: Natalie Hooper is a 53 y.o. female who presents for an 42 month follow-up cardiology evaluation. She is the daughter of Ms. Natalie Hooper.  Natalie Hooper has a history of palpitations.  An echo Doppler study November 2012 showed normal systolic and diastolic function. She had flat mitral valve coaptation without definitive prolapse and mild mitral regurgitation with trivial tricuspid regurgitation. She had normal pulmonary pressures.  She has been on metoprolol  succinate at 37.5 mg daily which has essentially controlled her palpitations. There is a very rare instance where she does note an occasional palpitation and she has taken an extra half of this metoprolol .  This usually occurs when she is resting under periods of increased stress.    I last saw her in October 2018.  At that time, she was doing well.  Palpitations were controlled with metoprolol .  She was sizing daily and remained active.    When I saw her in December 2019 she remained active and was often walking several miles and exercising daily.  She denied chest tightness or pressure.  She often walks several miles and exercises daily.  She denies any episodes of chest tightness or pressure.  She had undergone laboratory by her primary physician, Dr. Valora and total cholesterol was 210, triglycerides 77, HDL 72, and LDL 122.  She is not on any therapy for lipids.  She has a strong family history for CAD in her mother who had significant multivessel CAD and required CABG revascularization.  She has rare episodes of GERD for which she takes omeprazole as needed.    She was concerned about underlying coronary artery disease and I recommended a screening coronary calcium  score which was done in January 2020.  This demonstrated dense calcification in the proximal to mid LAD and her calcium  score was elevated at 122,  representing 99th percentile based on age and sex matched controls.  At that time, she also began to notice a vague sensation of chest fullness. Despite multiple attempts she has been unable to be approved for coronary CTA with FFR.  Options for other functional studies have been discussed but have been limited these because of the COVID pandemic. She saw Natalie Hooper, [facility] on 05/15/2019.  She ultimately underwent a routine grade exercise treadmill study since no other evaluations were approved by her insurance.  On the exercise treadmill test, she was able to exercise to a 12.9-met workload and 10 minutes and 50 seconds of exercise.  Peak heart rate was 171, representing 87% of predicted maximum.  She developed a vague chest sensation at stress but this was unassociated with ECG changes of ischemia.   With her abnormal cardiac calcium  score, I recommended initiation of statin therapy and since February has been on rosuvastatin  20 mg daily which she has tolerated well.  Previously she had been hesitant to initiate statin therapy.  I have discussed with her the importance of achieving an LDL less than 70 in an attempt to potentially induce plaque stability and with ultimate regression.  Recently she also had noticed some vague palpitations which have improved with further titration of Toprol -XL to 50 mg daily.     I evaluated her in a telemedicine visit on July 01, 2019.  Lipid studies in June 2020 by Dr. Valora showed improvement with initiation of statin therapy and total cholesterol was  177, triglycerides 118, HDL 83, and LDL 70.    I saw her in December 2020.  Over the previous several months she continued to do well.  She denied any recent chest pain.  Her palpitations have stabilized.  She underwent repeat laboratory on September 30, 2019.  Lipid studies were further improved with total cholesterol 156, and LDL cholesterol was now 58.  She denied any myalgias or arthralgias.  She denied palpitations or  chest pain.    I saw her on October 07, 2020 at which time she remained stable.  She was continuing to exercise regularly.  She denied any chest pain, PND orthopnea.  She continues to be on Toprol -XL 37.5 mg daily and does not have any palpitations.  She is tolerating rosuvastatin  20 mg for hyperlipidemia for potential plaque regression.  Laboratory from Dr. Valora on July 06, 2020 showed a total cholesterol 167, triglycerides 150, HDL 79.7, and LDL 57.  She had normal chemistry studies, LFTs, and hemoglobin/hematocrit was 13.1/39.3.    Since I last saw her, she has continued to do well.  Her to children are both at [facility] state.  She continues to exercise regularly.  She sees Dr. Valora for primary care and recently saw him on September 25, 2022.  She underwent laboratory on September 19, 2022 which showed hemoglobin 12.8 hematocrit 37.9.  Lipid studies revealed total cholesterol 176, triglycerides 112, HDL 95 and LDL 58.  Chemistry was normal.  LFTs were normal.  She continues to be on metoprolol  succinate 37.5 mg daily which controls palpitations and she is on rosuvastatin  20 mg daily which she is tolerating.  She takes omeprazole as needed for GERD.    I last saw her on August 06, 2023.  She underwent an LP(a) laboratory in December 26, 2022.  This was significantly elevated at 196.,  She remains relatively asymptomatic with exercise.  At times she has noticed some vague of both arms which often is nonexertional.  The past several months she has noted some intermittent right nostril nosebleeds.  She is continue to be followed by Dr. Valora for primary care.  Most recent laboratory on July 24, 2023 showed normal renal function with creatinine 0.79.  Cholesterol was 171, triglycerides 92, HDL 96, VLDL 16, and LDL cholesterol was now 59 on rosuvastatin  she was taking 20 mg and Zetia  10 mg.  She continues to be on metoprolol  succinate 37.5 mg daily.  She is on omeprazole.  During that evaluation, with  her LP(a) elevation I discussed potential increased risk for vulnerable plaque and recommended slight adjustment of her rosuvastatin  and take 40 mg alternating with 20 mg every other day continue Zetia  10 mg daily.  She was concerned about her coronary calcification as she was follow-up assessment.  She underwent coronary CTA September 03, 2023.  This showed coronary calcium  score of 227, representing 98 percentile.  Total plaque volume was 115 mm representing 74% for age and sex matched control (calcified plaque 34 mmHg with noncalcified plaque 81 mmHg).  Total plaque volume was felt to be mild.  She was found to have LAD calcification 201 with narrowing less than 25% and left circumflex calcification at 26, less than 25%.  Ms. Kosta underwent an evaluation with Christopher Pavero, [facility] and discussed potential PCSK9 inhibition.  She has not yet heard of the response concerning her eligibility.  With her strong family history for CAD, and LP (a) elevation, she wishes to initiate therapy.  Presently, Zeva feels well.  She  is exercising almost every day and denies chest pain or shortness of breath.  Her mother who has CAD will also be undergoing PV surgery.  She now has been taking her Zetia  10 mg and only rosuvastatin  20 mg.  I had Chris Pavero, [facility] into the room for recertification of her Repatha  potential and he we will expect a response either later this afternoon or tomorrow.   Past Medical History:  Diagnosis Date   Arrhythmia    Hx of Palp.   Hypertension    Mitral regurgitation    Palpitations    Unilateral inguinal hernia     Past Surgical History:  Procedure Laterality Date   COLONOSCOPY WITH PROPOFOL  N/A 03/18/2020   Procedure: COLONOSCOPY WITH PROPOFOL ;  Surgeon: Toledo, Ladell POUR, MD;  Location: [facility] ENDOSCOPY;  Service: Gastroenterology;  Laterality: N/A;   DILATION AND CURETTAGE OF UTERUS     DOBUTAMINE  STRESS ECHO  05/22/2011   Normal treadmill,normal stress echo   DOPPLER  ECHOCARDIOGRAPHY  10/03/2011   EF = >55%,mild mitral regurg   ESOPHAGOGASTRODUODENOSCOPY (EGD) WITH PROPOFOL  N/A 03/18/2020   Procedure: ESOPHAGOGASTRODUODENOSCOPY (EGD) WITH PROPOFOL ;  Surgeon: Toledo, Ladell POUR, MD;  Location: [facility] ENDOSCOPY;  Service: Gastroenterology;  Laterality: N/A;   Event Monitor  06/24/2008   NSR,Sinus Tach   INTRAUTERINE DEVICE (IUD) INSERTION     mirena  inserted 07-25-18   MYOMECTOMY  2011   TONSILLECTOMY     age 66    Allergies  Allergen Reactions   Iodinated Contrast Media Anaphylaxis    Patient states feeling throat tightness and then numbness after receiving IV contrast for ct coronary.     Current Outpatient Medications  Medication Sig Dispense Refill   acetaminophen (TYLENOL) 325 MG tablet Take 650 mg by mouth as needed.     aspirin EC 81 MG tablet Take 81 mg by mouth.     ibuprofen (ADVIL,MOTRIN) 200 MG tablet Take 200 mg by mouth as needed.     levonorgestrel  (MIRENA ) 20 MCG/24HR IUD 1 each by Intrauterine route once.     metoprolol  succinate (TOPROL -XL) 25 MG 24 hr tablet TAKE 1.5 TABLETS BY MOUTH DAILY. 135 tablet 3   Multiple Vitamin (MULTI-VITAMIN) tablet Take 1 tablet by mouth daily.     omeprazole (PRILOSEC) 20 MG capsule Take 1 capsule by mouth as needed. Pt takes 1 tablet daily     ezetimibe  (ZETIA ) 10 MG tablet Take 1 tablet (10 mg total) by mouth daily. 90 tablet 3   rosuvastatin  (CRESTOR ) 20 MG tablet TAKE 1 TABLET (20 MG TOTAL) BY MOUTH DAILY. (Patient taking differently: Take 20 mg by mouth daily. Pt. Alternates 20-40 mg every other day) 90 tablet 3   No current facility-administered medications for this visit.    Social History   Socioeconomic History   Marital status: Married    Spouse name: Not on file   Number of children: Not on file   Years of education: Not on file   Highest education level: Not on file  Occupational History   Not on file  Tobacco Use   Smoking status: Former    Current packs/day: 0.00    Types:  Cigarettes    Start date: 12/11/1991    Quit date: 12/10/1996    Years since quitting: 26.9   Smokeless tobacco: Never  Vaping Use   Vaping status: Never Used  Substance and Sexual Activity   Alcohol use: Yes    Comment: social beer and wine   Drug use: No  Sexual activity: Yes    Partners: Male    Birth control/protection: I.U.D.    Comment: intercourse age 53, less than 5 sexual partners,des neg  Other Topics Concern   Not on file  Social History Narrative   Not on file   Social Drivers of Health   Financial Resource Strain: Patient Declined (09/27/2023)   Received from [facility]   Overall Financial Resource Strain (CARDIA)    Difficulty of Paying Living Expenses: Patient declined  Food Insecurity: Patient Declined (09/27/2023)   Received from [facility]   Hunger Vital Sign    Worried About Running Out of Food in the Last Year: Patient declined    Ran Out of Food in the Last Year: Patient declined  Transportation Needs: Patient Declined (09/27/2023)   Received from [facility] - Transportation    In the past 12 months, has lack of transportation kept you from medical appointments or from getting medications?: Patient declined    Lack of Transportation (Non-Medical): Patient declined  Physical Activity: Not on file  Stress: Not on file  Social Connections: Not on file  Intimate Partner Violence: Not on file   Socially she is married and has 2 children, with her oldest now a senior at [employer] and her youngest also at [employer].  There is no tobacco use. She does drink occasional alcohol.  Family History  Problem Relation Age of Onset   Heart disease Mother 46       triple bypass   Hypertension Mother    Lung cancer Mother    Leukemia Mother    Other Mother        CLL   Diabetes Maternal Aunt    Colon cancer Maternal Uncle    Heart disease Maternal Uncle    Hypertension Maternal Grandmother    Heart  disease Maternal Grandmother    Breast cancer Maternal Grandmother    Hypertension Maternal Grandfather    Diabetes Maternal Grandfather    Heart disease Maternal Grandfather 55   ROS General: Negative; No fevers, chills, or night sweats;  HEENT: Positive for rare nosebleeds; No changes in vision or hearing, sinus congestion, difficulty swallowing Pulmonary: Negative; No cough, wheezing, shortness of breath, hemoptysis Cardiovascular: Negative; No chest pain, presyncope, syncope, palpitations GI: Positive for mild GERD; No nausea, vomiting, diarrhea, or abdominal pain GU: Negative; No dysuria, hematuria, or difficulty voiding Musculoskeletal: Negative; no myalgias, joint pain, or weakness Hematologic/Oncology: Negative; no easy bruising, bleeding Endocrine: Negative; no heat/cold intolerance; no diabetes Neuro: Negative; no changes in balance, headaches Skin: Negative; No rashes or skin lesions Psychiatric: Negative; No behavioral problems, depression Sleep: Negative; No snoring, daytime sleepiness, hypersomnolence, bruxism, restless legs, hypnogognic hallucinations, no cataplexy Other comprehensive 14 point system review is negative.   PE BP 104/78   Pulse 62   Ht 5' 2 (1.575 m)   Wt 114 lb 9.6 oz (52 kg)   SpO2 100%   BMI 20.96 kg/m    Repeat blood pressure by me was 126/70 supine and 126/72 standing  Wt Readings from Last 3 Encounters:  11/14/23 114 lb 9.6 oz (52 kg)  08/06/23 114 lb 3.2 oz (51.8 kg)  03/21/23 115 lb (52.2 kg)   General: Alert, oriented, no distress.  Skin: normal turgor, no rashes, warm and dry HEENT: Normocephalic, atraumatic. Pupils equal round and reactive to light; sclera anicteric; extraocular muscles intact; Nose without nasal septal hypertrophy Mouth/Parynx benign; Mallinpatti scale 3 Neck:  No JVD, no carotid bruits; normal carotid upstroke Lungs: clear to ausculatation and percussion; no wheezing or rales Chest wall: without tenderness to  palpitation Heart: PMI not displaced, RRR, s1 s2 normal, 1/6 systolic murmur, no diastolic murmur, no rubs, gallops, thrills, or heaves Abdomen: soft, nontender; no hepatosplenomehaly, BS+; abdominal aorta nontender and not dilated by palpation. Back: no CVA tenderness Pulses 2+ Musculoskeletal: full range of motion, normal strength, no joint deformities Extremities: no clubbing cyanosis or edema, Homan's sign negative  Neurologic: grossly nonfocal; Cranial nerves grossly wnl Psychologic: Normal mood and affect    EKG Interpretation Date/Time:  Wednesday November 14 2023 12:15:12 EST Ventricular Rate:  62 PR Interval:  164 QRS Duration:  90 QT Interval:  406 QTC Calculation: 412 R Axis:   49  Text Interpretation: Normal sinus rhythm Normal ECG When compared with ECG of 06-Aug-2023 16:10, No significant change was found Confirmed by Burnard Ned ([id number]) on 11/14/2023 3:52:28 PM     August 06, 2023 ECG (independently read by me): Normal sinus rhythm at 64 bpm.  Normal intervals.   October 04, 2022 ECG (independently read by me): NSR at 65, normal intervals  October 07, 2020 ECG (independently read by me): NSR at 69; no ectopy. Normal intervals  December 2020 ECG (independently read by me): NSR aT 65; NO ECTOPY; NORMAL INTERVALS  December 2019 ECG (independently read by me): Normal sinus rhythm at 63 bpm.  Mild RV conduction delay.  No ectopy.  Normal intervals.  October 2018 ECG (independently read by me): Normal sinus rhythm at 64 bpm.  September 2017 ECG (independently read by me): Normal sinus rhythm at 70 bpm.  Mild RV conduction delay.  Normal intervals.  February 2016 ECG (independently read by me): Normal sinus rhythm at 65 bpm.  No ectopy.  Normal intervals.  Mild RV conduction delay.  February 2015 ECG (independently read by me): Normal sinus rhythm at 68 beats per minute. Normal intervals. No ectopy  LABS:    Latest Ref Rng & Units 07/24/2023    9:28 AM 09/30/2019     9:21 AM 07/06/2016    8:03 AM  BMP  Glucose 70 - 99 mg/dL 89  87  93   BUN 6 - 24 mg/dL 12  13  15    Creatinine 0.57 - 1.00 mg/dL 9.20  9.24  9.24   BUN/Creat Ratio 9 - 23 15  17  20    Sodium 134 - 144 mmol/L 141  138  140   Potassium 3.5 - 5.2 mmol/L 4.4  3.9  5.0   Chloride 96 - 106 mmol/L 103  102  102   CO2 20 - 29 mmol/L 25  25  23    Calcium  8.7 - 10.2 mg/dL 9.6  9.2  9.5       Latest Ref Rng & Units 07/24/2023    9:28 AM 09/30/2019    9:21 AM 07/06/2016    8:03 AM  Hepatic Function  Total Protein 6.0 - 8.5 g/dL 6.9  6.3  6.8   Albumin 3.8 - 4.9 g/dL 4.6  4.3  4.5   AST 0 - 40 IU/L 30  16  20    ALT 0 - 32 IU/L 28  11  12    Alk Phosphatase 44 - 121 IU/L 60  47  45   Total Bilirubin 0.0 - 1.2 mg/dL 0.4  0.4  0.4       Latest Ref Rng & Units 07/06/2016    8:03 AM 07/12/2010  9:05 AM  CBC  WBC 3.4 - 10.8 x10E3/uL 6.3  8.6   Hemoglobin 11.1 - 15.9 g/dL 86.4  87.3   Hematocrit 34.0 - 46.6 % 38.9  36.6   Platelets 150 - 379 x10E3/uL 278  318    Lab Results  Component Value Date   MCV 91 07/06/2016   MCV 92.5 07/12/2010   Lab Results  Component Value Date   TSH 2.590 07/06/2016   No results found for: HGBA1C  Lipid Panel     Component Value Date/Time   CHOL 171 07/24/2023 0928   TRIG 92 07/24/2023 0928   HDL 96 07/24/2023 0928   CHOLHDL 1.8 07/24/2023 0928   LDLCALC 59 07/24/2023 0928    RADIOLOGY: No results found.  CARDIAC STUDIES  CT CARDIAC SCORING : November 12, 2018. ADDENDUM REPORT: 11/13/2018 13:15   CLINICAL DATA:  Risk stratification   EXAM: Coronary Calcium  Score   TECHNIQUE: The patient was scanned on a Siemens Somatom 64 slice scanner. Axial non-contrast 3 mm slices were carried out through the heart. The data set was analyzed on a dedicated work station and scored using the Agatson method.   FINDINGS: Non-cardiac: See separate report from [facility].   Ascending aorta: Normal diameter 2.9 cm   Pericardium: Normal    Coronary arteries: Dense calcium  noted in proximal and mid LAD   IMPRESSION: Coronary calcium  score of 122. This was 23 th percentile for age and sex matched control.   Exercise tolerance test May 27, 2019:  Study Highlights  Blood pressure and heart rate demonstrated a normal response to exercise. There was no ST segment deviation noted during stress. Patient had good exercise capacity, exercising for 10 minutes Study stopped for fatigue and atypical chest pain.    IMPRESSION:  1. Nonrheumatic mitral valve regurgitation   2. Primary hypertension   3. Coronary artery disease involving native coronary artery of native heart without angina pectoris   4. Elevated Lp(a)   5. Hyperlipidemia with target LDL less than 50   6. Palpitations     ASSESSMENT AND PLAN: Ms. Komal Stangelo is a very pleasant young appearing 52 year-old female who has documented normal systolic and diastolic function with flat mitral valve closure and mild mitral regurgitation by an echo Doppler study in 2012. She was not found to have definitive mitral valve prolapse. She has an intermittent click on exam and a systolic murmur suggestive of MR.  Due to her significant family history for CAD, a CT cardiac score was done in January 2020 which revealed an elevated calcium  score at 122 which was 99th percentile for age and sex matched control.  Subsequent exercise treadmill testing demonstrated good exercise tolerance without chest pain development or ECG abnormalities.  Based on her coronary calcification and subclinical atherosclerosis she has been treated with rosuvastatin  20 mg.  Subsequent lipid studies have been excellent with LDLs typically in the mid to upper 50s.   Laboratory from September 19, 2022 showed total cholesterol at 176, triglycerides 112, excellent HDL at 95, and LDL at 58.  She does not have any symptoms of chest pain or exertional dyspnea.  She is tolerating metoprolol  succinate 37.5 mg daily and is  unaware of any palpitations.  Her blood pressure has been stable.  At her previous office visit I recommended patient of Zetia , rosuvastatin  and once she was found to have increased LP(a) at 196 it was recommended she further titrate rosuvastatin  and take 40 mg alternating with 20 mg in  addition to her Zetia .  He has been interested in super aggressive treatment.  She was evaluated by Christopher Pavero, [facility] and qualify for Repatha .  I had him into the office with her again today for attempt at approval of her Repatha  regimen.  He believes he will find this information out later today or tomorrow and most likely she will be covered.  In 3 months, we will then recheck laboratory with chemistry, lipid panel and recheck LP(a).  Depending upon result, it may be possible to slightly decrease her oral regimen.  I will see her in 4 months for follow-up evaluation.  I discussed with her my plan for retirement later this year and at that office visit I will transition her to another cardiologist.    .  Her last office visit I recommended the addition of Zetia  10 mg which she has initiated.  Her diet may have been somewhat altered this summer.  Her most recent lipid studies showed total cholesterol 171 triglycerides 92 HDL 96 and LDL has not significantly changed and currently is at 59.  I reviewed her LP(a) value which is significantly elevated at 196.  I discussed the potential for increased vulnerable plaque associated with significant LP(a) elevation.  I have suggested slight adjustment in her rosuvastatin  dose and she will take 40 mg alternating with 20 mg every other day and continue Zetia .  She is concerned about her coronary calcification.  At times she has noted some vague nonexertional sensation.  I have ended we try scheduling her for coronary CTA for further evaluation of luminal stenosis.  With her LP(a) elevation I have suggested reinitiation of baby aspirin 81 mg.  If she does experience increased  nosebleeds this may need to be discontinued.  In 3 to 4 months I have recommended a repeat comprehensive metabolic panel and fasting lipid panel.  She continues to take omeprazole as needed for GERD.  I will see her in 6 to 7 months for cardiology reassessment.   Debby RONAL Sor, MD, [facility]  11/14/2023 4:07 PM

## 2023-11-14 NOTE — Telephone Encounter (Signed)
 Pharmacy Patient Advocate Encounter   Received notification from Physician's Office that prior authorization for REPATHA  is required/requested.   Insurance verification completed.   The patient is insured through [facility] .   Per test claim: PA required; PA submitted to above mentioned insurance via CoverMyMeds Key/confirmation #/EOC [id number] Status is pending

## 2023-11-15 ENCOUNTER — Encounter: Payer: Self-pay | Admitting: Pharmacist

## 2023-11-15 ENCOUNTER — Other Ambulatory Visit (HOSPITAL_COMMUNITY): Payer: Self-pay

## 2023-11-15 NOTE — Telephone Encounter (Signed)
 Pharmacy Patient Advocate Encounter  Received notification from [facility] that Prior Authorization for REPATHA  has been DENIED.  Full denial letter will be uploaded to the media tab. See denial reason below. PER PLAN: Denied because labs are not in approval range.

## 2023-11-19 ENCOUNTER — Ambulatory Visit: Payer: 59 | Admitting: Cardiovascular Disease

## 2023-11-29 ENCOUNTER — Encounter: Payer: Self-pay | Admitting: Cardiovascular Disease

## 2023-12-05 ENCOUNTER — Other Ambulatory Visit (HOSPITAL_COMMUNITY): Payer: Self-pay

## 2024-02-29 LAB — LIPOPROTEIN A (LPA): Lipoprotein (a): 168.5 nmol/L — ABNORMAL HIGH (ref <75.0)

## 2024-02-29 LAB — COMPREHENSIVE METABOLIC PANEL WITH GFR
ALT: 20 IU/L (ref 0–32)
AST: 24 IU/L (ref 0–40)
Albumin: 4.4 g/dL (ref 3.8–4.9)
Alkaline Phosphatase: 62 IU/L (ref 44–121)
BUN/Creatinine Ratio: 20 (ref 9–23)
BUN: 16 mg/dL (ref 6–24)
Bilirubin Total: 0.4 mg/dL (ref 0.0–1.2)
CO2: 24 mmol/L (ref 20–29)
Calcium: 9.5 mg/dL (ref 8.7–10.2)
Chloride: 98 mmol/L (ref 96–106)
Creatinine, Ser: 0.81 mg/dL (ref 0.57–1.00)
Globulin, Total: 2.2 g/dL (ref 1.5–4.5)
Glucose: 83 mg/dL (ref 70–99)
Potassium: 4.4 mmol/L (ref 3.5–5.2)
Sodium: 137 mmol/L (ref 134–144)
Total Protein: 6.6 g/dL (ref 6.0–8.5)
eGFR: 86 mL/min/{1.73_m2} (ref >59)

## 2024-02-29 LAB — LIPID PANEL
Chol/HDL Ratio: 2.2 ratio (ref 0.0–4.4)
Cholesterol, Total: 218 mg/dL — ABNORMAL HIGH (ref 100–199)
HDL: 101 mg/dL (ref >39)
LDL Chol Calc (NIH): 106 mg/dL — ABNORMAL HIGH (ref 0–99)
Triglycerides: 65 mg/dL (ref 0–149)
VLDL Cholesterol Cal: 11 mg/dL (ref 5–40)

## 2024-03-24 ENCOUNTER — Ambulatory Visit: Payer: 59 | Attending: Cardiovascular Disease | Admitting: Cardiovascular Disease

## 2024-03-24 ENCOUNTER — Telehealth: Payer: Self-pay | Admitting: Pharmacist Clinician (PhC)/ Clinical Pharmacy Specialist

## 2024-03-24 ENCOUNTER — Encounter: Payer: Self-pay | Admitting: Cardiovascular Disease

## 2024-03-24 ENCOUNTER — Ambulatory Visit: Payer: 59 | Admitting: Cardiovascular Disease

## 2024-03-24 DIAGNOSIS — I10 Essential (primary) hypertension: Secondary | ICD-10-CM

## 2024-03-24 DIAGNOSIS — I2510 Atherosclerotic heart disease of native coronary artery without angina pectoris: Secondary | ICD-10-CM | POA: Diagnosis not present

## 2024-03-24 DIAGNOSIS — I340 Nonrheumatic mitral (valve) insufficiency: Secondary | ICD-10-CM | POA: Diagnosis not present

## 2024-03-24 DIAGNOSIS — E785 Hyperlipidemia, unspecified: Secondary | ICD-10-CM

## 2024-03-24 DIAGNOSIS — E7841 Elevated Lipoprotein(a): Secondary | ICD-10-CM

## 2024-03-24 MED ORDER — EZETIMIBE 10 MG PO TABS: 10.0000 mg | ORAL_TABLET | Freq: Every day | ORAL | 3 refills | Status: DC

## 2024-03-24 NOTE — Patient Instructions (Signed)
 Medication Instructions:  RESUME TAKING ZETIA  10 MG DAILY *If you need a refill on your cardiac medications before your next appointment, please call your pharmacy*  Lab Work: NO LABS If you have labs (blood work) drawn today and your tests are completely normal, you will receive your results only by: MyChart Message (if you have MyChart) OR A paper copy in the mail If you have any lab test that is abnormal or we need to change your treatment, we will call you to review the results.  Testing/Procedures: NO TESTING  Follow-Up: At [facility], you and your health needs are our priority.  As part of our continuing mission to provide you with exceptional heart care, our providers are all part of one team.  This team includes your primary Cardiologist (physician) and Advanced Practice Providers or APPs (Physician Assistants and Nurse Practitioners) who all work together to provide you with the care you need, when you need it.  Your next appointment:   6 month(s)  Provider:   Carson Clara, MD

## 2024-03-24 NOTE — Progress Notes (Signed)
 Patient ID: Natalie Hooper, female   DOB: 24-May-1971, 53 y.o.   MRN: [id number]       Primary M.D.: Dr. Denys Flash  HPI: Natalie Hooper is a 53 y.o. female who presents for a month follow-up cardiology evaluation. She is the daughter of Ms. Natalie Hooper.  Ms. Vanschaick has a history of palpitations.  An echo Doppler study November 2012 showed normal systolic and diastolic function. She had flat mitral valve coaptation without definitive prolapse and mild mitral regurgitation with trivial tricuspid regurgitation. She had normal pulmonary pressures.  She has been on metoprolol  succinate at 37.5 mg daily which has essentially controlled her palpitations. There is a very rare instance where she does note an occasional palpitation and she has taken an extra half of this metoprolol .  This usually occurs when she is resting under periods of increased stress.    I saw her in October 2018.  At that time, she was doing well.  Palpitations were controlled with metoprolol .  She was sizing daily and remained active.    When I saw her in December 2019 she remained active and was often walking several miles and exercising daily.  She denied chest tightness or pressure.  She often walks several miles and exercises daily.  She denies any episodes of chest tightness or pressure.  She had undergone laboratory by her primary physician, Dr. Dean Every and total cholesterol was 210, triglycerides 77, HDL 72, and LDL 122.  She is not on any therapy for lipids.  She has a strong family history for CAD in her mother who had significant multivessel CAD and required CABG revascularization.  She has rare episodes of GERD for which she takes omeprazole as needed.    She was concerned about underlying coronary artery disease and I recommended a screening coronary calcium  score which was done in January 2020.  This demonstrated dense calcification in the proximal to mid LAD and her calcium  score was elevated at 122, representing 99th  percentile based on age and sex matched controls.  At that time, she also began to notice a vague sensation of chest fullness. Despite multiple attempts she has been unable to be approved for coronary CTA with FFR.  Options for other functional studies have been discussed but have been limited these because of the COVID pandemic. She saw Humphrey Magnuson, [facility] on 05/15/2019.  She ultimately underwent a routine grade exercise treadmill study since no other evaluations were approved by her insurance.  On the exercise treadmill test, she was able to exercise to a 12.9-met workload and 10 minutes and 50 seconds of exercise.  Peak heart rate was 171, representing 87% of predicted maximum.  She developed a vague chest sensation at stress but this was unassociated with ECG changes of ischemia.   With her abnormal cardiac calcium  score, I recommended initiation of statin therapy and since February has been on rosuvastatin  20 mg daily which she has tolerated well.  Previously she had been hesitant to initiate statin therapy.  I have discussed with her the importance of achieving an LDL less than 70 in an attempt to potentially induce plaque stability and with ultimate regression.  Recently she also had noticed some vague palpitations which have improved with further titration of Toprol -XL to 50 mg daily.     I evaluated her in a telemedicine visit on July 01, 2019.  Lipid studies in June 2020 by Dr. Dean Every showed improvement with initiation of statin therapy and total cholesterol was 177, triglycerides  118, HDL 83, and LDL 70.    I saw her in December 2020.  Over the previous several months she continued to do well.  She denied any recent chest pain.  Her palpitations have stabilized.  She underwent repeat laboratory on September 30, 2019.  Lipid studies were further improved with total cholesterol 156, and LDL cholesterol was now 58.  She denied any myalgias or arthralgias.  She denied palpitations or chest pain.    I saw  her on October 07, 2020 at which time she remained stable.  She was continuing to exercise regularly.  She denied any chest pain, PND orthopnea.  She continues to be on Toprol -XL 37.5 mg daily and does not have any palpitations.  She is tolerating rosuvastatin  20 mg for hyperlipidemia for potential plaque regression.  Laboratory from Dr. Dean Every on July 06, 2020 showed a total cholesterol 167, triglycerides 150, HDL 79.7, and LDL 57.  She had normal chemistry studies, LFTs, and hemoglobin/hematocrit was 13.1/39.3.    Since I last saw her, she has continued to do well.  Her to children are both at [facility] state.  She continues to exercise regularly.  She sees Dr. Dean Every for primary care and recently saw him on September 25, 2022.  She underwent laboratory on September 19, 2022 which showed hemoglobin 12.8 hematocrit 37.9.  Lipid studies revealed total cholesterol 176, triglycerides 112, HDL 95 and LDL 58.  Chemistry was normal.  LFTs were normal.  She continues to be on metoprolol  succinate 37.5 mg daily which controls palpitations and she is on rosuvastatin  20 mg daily which she is tolerating.  She takes omeprazole as needed for GERD.    I last saw her on August 06, 2023.  She underwent an LP(a) laboratory in December 26, 2022.  This was significantly elevated at 196.,  She remains relatively asymptomatic with exercise.  At times she has noticed some vague of both arms which often is nonexertional.  The past several months she has noted some intermittent right nostril nosebleeds.  She is continue to be followed by Dr. Dean Every for primary care.  Most recent laboratory on July 24, 2023 showed normal renal function with creatinine 0.79.  Cholesterol was 171, triglycerides 92, HDL 96, VLDL 16, and LDL cholesterol was now 59 on rosuvastatin  she was taking 20 mg and Zetia  10 mg.  She continues to be on metoprolol  succinate 37.5 mg daily.  She is on omeprazole.  During that evaluation, with her LP(a) elevation I  discussed potential increased risk for vulnerable plaque and recommended slight adjustment of her rosuvastatin  and take 40 mg alternating with 20 mg every other day continue Zetia  10 mg daily.  She was concerned about her coronary calcification as she was follow-up assessment.  She underwent coronary CTA September 03, 2023.  This showed coronary calcium  score of 227, representing 98 percentile.  Total plaque volume was 115 mm representing 74% for age and sex matched control (calcified plaque 34 mmHg with noncalcified plaque 81 mmHg).  Total plaque volume was felt to be mild.  She was found to have LAD calcification 201 with narrowing less than 25% and left circumflex calcification at 26, less than 25%.  Ms. Suman underwent an evaluation with Christopher Pavero, [facility] and discussed potential PCSK9 inhibition.  She has not yet heard of the response concerning her eligibility.  With her strong family history for CAD, and LP (a) elevation, she wishes to initiate therapy.  I last saw her on November 14, 2023.  She continued to feel well and was exercising almost every day.  She denied any chest pain or shortness of breath.  Her mother who has CAD will also be undergoing PV surgery.  She now has been taking her Zetia  10 mg and only rosuvastatin  20 mg.  I had Chris Pavero, RPH into the room for recertification of her Repatha potential   Since her last visit, she had stopped taking Zetia  and reduced the rosuvastatin  to 10 mg per discussion with Winton Haws, RPH.  She underwent subsequent lipid evaluation on February 28, 2024.  As anticipated total cholesterol had increased to 218, LDL cholesterol was 106.  She was now hoping to meet criteria for initiation of Repatha therapy.  She has resumed taking the rosuvastatin  at 20 mg.  She is very concerned with the congenital history with her mother's extensive CAD and PVD.  A coronary CTA has been performed on data which showed a calcium  score of 227 representing 98th  percentile.  Total plaque volume was 115 mm representing 74% for age and sex matched control with calcified plaque at 34 mmHg and noncalcified plaque 81 mmHg.  LAD calcification was 201 and left circumflex at 26 with both less than 25 stenoses.  She presents for follow-up evaluation.  Past Medical History:  Diagnosis Date   Arrhythmia    Hx of Palp.   Hypertension    Mitral regurgitation    Palpitations    Unilateral inguinal hernia     Past Surgical History:  Procedure Laterality Date   COLONOSCOPY WITH PROPOFOL  N/A 03/18/2020   Procedure: COLONOSCOPY WITH PROPOFOL ;  Surgeon: Toledo, Alphonsus Jeans, MD;  Location: [facility] ENDOSCOPY;  Service: Gastroenterology;  Laterality: N/A;   DILATION AND CURETTAGE OF UTERUS     DOBUTAMINE  STRESS ECHO  05/22/2011   Normal treadmill,normal stress echo   DOPPLER ECHOCARDIOGRAPHY  10/03/2011   EF = >55%,mild mitral regurg   ESOPHAGOGASTRODUODENOSCOPY (EGD) WITH PROPOFOL  N/A 03/18/2020   Procedure: ESOPHAGOGASTRODUODENOSCOPY (EGD) WITH PROPOFOL ;  Surgeon: Toledo, Alphonsus Jeans, MD;  Location: [facility] ENDOSCOPY;  Service: Gastroenterology;  Laterality: N/A;   Event Monitor  06/24/2008   NSR,Sinus Tach   INTRAUTERINE DEVICE (IUD) INSERTION     mirena  inserted 07-25-18   MYOMECTOMY  2011   TONSILLECTOMY     age 31    Allergies  Allergen Reactions   Iodinated Contrast Media Anaphylaxis    Patient states feeling "throat tightness and then numbness" after receiving IV contrast for ct coronary.     Current Outpatient Medications  Medication Sig Dispense Refill   acetaminophen (TYLENOL) 325 MG tablet Take 650 mg by mouth as needed.     aspirin EC 81 MG tablet Take 81 mg by mouth.     ibuprofen (ADVIL,MOTRIN) 200 MG tablet Take 200 mg by mouth as needed.     levonorgestrel  (MIRENA ) 20 MCG/24HR IUD 1 each by Intrauterine route once.     metoprolol  succinate (TOPROL -XL) 25 MG 24 hr tablet TAKE 1.5 TABLETS BY MOUTH DAILY. 135 tablet 3   Multiple Vitamin  (MULTI-VITAMIN) tablet Take 1 tablet by mouth daily.     omeprazole (PRILOSEC) 20 MG capsule Take 1 capsule by mouth as needed. Pt takes 1 tablet daily     ezetimibe  (ZETIA ) 10 MG tablet Take 1 tablet (10 mg total) by mouth daily. 90 tablet 3   rosuvastatin  (CRESTOR ) 20 MG tablet TAKE 1 TABLET (20 MG TOTAL) BY MOUTH DAILY. (Patient taking differently: Take 20 mg by mouth daily. Pt. Alternates 20-40  mg every other day) 90 tablet 3   No current facility-administered medications for this visit.    Social History   Socioeconomic History   Marital status: Married    Spouse name: Not on file   Number of children: Not on file   Years of education: Not on file   Highest education level: Not on file  Occupational History   Not on file  Tobacco Use   Smoking status: Former    Current packs/day: 0.00    Types: Cigarettes    Start date: 12/11/1991    Quit date: 12/10/1996    Years since quitting: 27.3   Smokeless tobacco: Never  Vaping Use   Vaping status: Never Used  Substance and Sexual Activity   Alcohol use: Yes    Comment: social beer and wine   Drug use: No   Sexual activity: Yes    Partners: Male    Birth control/protection: I.U.D.    Comment: intercourse age 5, less than 5 sexual partners,des neg  Other Topics Concern   Not on file  Social History Narrative   Not on file   Social Drivers of Health   Financial Resource Strain: Patient Declined (09/27/2023)   Received from [facility]   Overall Financial Resource Strain (CARDIA)    Difficulty of Paying Living Expenses: Patient declined  Food Insecurity: Patient Declined (09/27/2023)   Received from [facility]   Hunger Vital Sign    Worried About Running Out of Food in the Last Year: Patient declined    Ran Out of Food in the Last Year: Patient declined  Transportation Needs: Patient Declined (09/27/2023)   Received from [facility] - [facility]    In  the past 12 months, has lack of transportation kept you from medical appointments or from getting medications?: Patient declined    Lack of Transportation (Non-Medical): Patient declined  Physical Activity: Not on file  Stress: Not on file  Social Connections: Not on file  Intimate Partner Violence: Not on file   Socially she is married and has 2 children, with her oldest now a senior at [employer] and her youngest also at [employer].  There is no tobacco use. She does drink occasional alcohol.  Family History  Problem Relation Age of Onset   Heart disease Mother 89       triple bypass   Hypertension Mother    Lung cancer Mother    Leukemia Mother    Other Mother        CLL   Diabetes Maternal Aunt    Colon cancer Maternal Uncle    Heart disease Maternal Uncle    Hypertension Maternal Grandmother    Heart disease Maternal Grandmother    Breast cancer Maternal Grandmother    Hypertension Maternal Grandfather    Diabetes Maternal Grandfather    Heart disease Maternal Grandfather 55   ROS General: Negative; No fevers, chills, or night sweats;  HEENT: Positive for rare nosebleeds; No changes in vision or hearing, sinus congestion, difficulty swallowing Pulmonary: Negative; No cough, wheezing, shortness of breath, hemoptysis Cardiovascular: Negative; No chest pain, presyncope, syncope, palpitations GI: Positive for mild GERD; No nausea, vomiting, diarrhea, or abdominal pain GU: Negative; No dysuria, hematuria, or difficulty voiding Musculoskeletal: Negative; no myalgias, joint pain, or weakness Hematologic/Oncology: Negative; no easy bruising, bleeding Endocrine: Negative; no heat/cold intolerance; no diabetes Neuro: Negative; no changes in balance, headaches Skin: Negative; No rashes or skin lesions  Psychiatric: Negative; No behavioral problems, depression Sleep: Negative; No snoring, daytime sleepiness, hypersomnolence, bruxism, restless legs, hypnogognic hallucinations, no  cataplexy Other comprehensive 14 point system review is negative.   PE BP 124/72   Pulse 68   Ht 5\' 2"  (1.575 m)   Wt 109 lb 3.2 oz (49.5 kg)   SpO2 100%   BMI 19.97 kg/m    Repeat blood pressure by me was 122/76  Wt Readings from Last 3 Encounters:  03/24/24 109 lb 3.2 oz (49.5 kg)  11/14/23 114 lb 9.6 oz (52 kg)  08/06/23 114 lb 3.2 oz (51.8 kg)   General: Alert, oriented, no distress.  Skin: normal turgor, no rashes, warm and dry HEENT: Normocephalic, atraumatic. Pupils equal round and reactive to light; sclera anicteric; extraocular muscles intact;  Nose without nasal septal hypertrophy Mouth/Parynx benign; Mallinpatti scale 2/3 Neck: No JVD, no carotid bruits; normal carotid upstroke Lungs: clear to ausculatation and percussion; no wheezing or rales Chest wall: without tenderness to palpitation Heart: PMI not displaced, RRR, s1 s2 normal, 1/6 systolic murmur, no diastolic murmur, intermittent click, no rubs, gallops, thrills, or heaves Abdomen: soft, nontender; no hepatosplenomehaly, BS+; abdominal aorta nontender and not dilated by palpation. Back: no CVA tenderness Pulses 2+ Musculoskeletal: full range of motion, normal strength, no joint deformities Extremities: no clubbing cyanosis or edema, Homan's sign negative  Neurologic: grossly nonfocal; Cranial nerves grossly wnl Psychologic: Normal mood and affect  EKG Interpretation Date/Time:  Monday Mar 24 2024 13:46:45 EDT Ventricular Rate:  68 PR Interval:  156 QRS Duration:  92 QT Interval:  390 QTC Calculation: 414 R Axis:   48  Text Interpretation: Normal sinus rhythm Normal ECG When compared with ECG of 14-Nov-2023 12:15, No significant change was found Confirmed by Magnus Schuller ([id number]) on 03/24/2024 3:51:29 PM     August 06, 2023 ECG (independently read by me): Normal sinus rhythm at 64 bpm.  Normal intervals.   October 04, 2022 ECG (independently read by me): NSR at 65, normal intervals  October 07, 2020 ECG (independently read by me): NSR at 69; no ectopy. Normal intervals  December 2020 ECG (independently read by me): NSR aT 65; NO ECTOPY; NORMAL INTERVALS  December 2019 ECG (independently read by me): Normal sinus rhythm at 63 bpm.  Mild RV conduction delay.  No ectopy.  Normal intervals.  October 2018 ECG (independently read by me): Normal sinus rhythm at 64 bpm.  September 2017 ECG (independently read by me): Normal sinus rhythm at 70 bpm.  Mild RV conduction delay.  Normal intervals.  February 2016 ECG (independently read by me): Normal sinus rhythm at 65 bpm.  No ectopy.  Normal intervals.  Mild RV conduction delay.  February 2015 ECG (independently read by me): Normal sinus rhythm at 68 beats per minute. Normal intervals. No ectopy  LABS:    Latest Ref Rng & Units 02/28/2024   11:37 AM 07/24/2023    9:28 AM 09/30/2019    9:21 AM  BMP  Glucose 70 - 99 mg/dL 83  89  87   BUN 6 - 24 mg/dL 16  12  13    Creatinine 0.57 - 1.00 mg/dL 0.98  1.19  1.47   BUN/Creat Ratio 9 - 23 20  15  17    Sodium 134 - 144 mmol/L 137  141  138   Potassium 3.5 - 5.2 mmol/L 4.4  4.4  3.9   Chloride 96 - 106 mmol/L 98  103  102   CO2 20 -  29 mmol/L 24  25  25    Calcium  8.7 - 10.2 mg/dL 9.5  9.6  9.2       Latest Ref Rng & Units 02/28/2024   11:37 AM 07/24/2023    9:28 AM 09/30/2019    9:21 AM  Hepatic Function  Total Protein 6.0 - 8.5 g/dL 6.6  6.9  6.3   Albumin 3.8 - 4.9 g/dL 4.4  4.6  4.3   AST 0 - 40 IU/L 24  30  16    ALT 0 - 32 IU/L 20  28  11    Alk Phosphatase 44 - 121 IU/L 62  60  47   Total Bilirubin 0.0 - 1.2 mg/dL 0.4  0.4  0.4       Latest Ref Rng & Units 07/06/2016    8:03 AM 07/12/2010    9:05 AM  CBC  WBC 3.4 - 10.8 x10E3/uL 6.3  8.6   Hemoglobin 11.1 - 15.9 g/dL 40.9  81.1   Hematocrit 34.0 - 46.6 % 38.9  36.6   Platelets 150 - 379 x10E3/uL 278  318    Lab Results  Component Value Date   MCV 91 07/06/2016   MCV 92.5 07/12/2010   Lab Results  Component Value Date    TSH 2.590 07/06/2016   No results found for: "HGBA1C"  Lipid Panel     Component Value Date/Time   CHOL 218 (H) 02/28/2024 1137   TRIG 65 02/28/2024 1137   HDL 101 02/28/2024 1137   CHOLHDL 2.2 02/28/2024 1137   LDLCALC 106 (H) 02/28/2024 1137    RADIOLOGY: No results found.  CARDIAC STUDIES  CT CARDIAC SCORING : November 12, 2018. ADDENDUM REPORT: 11/13/2018 13:15   CLINICAL DATA:  Risk stratification   EXAM: Coronary Calcium  Score   TECHNIQUE: The patient was scanned on a Siemens Somatom 64 slice scanner. Axial non-contrast 3 mm slices were carried out through the heart. The data set was analyzed on a dedicated work station and scored using the Agatson method.   FINDINGS: Non-cardiac: See separate report from [facility].   Ascending aorta: Normal diameter 2.9 cm   Pericardium: Normal   Coronary arteries: Dense calcium  noted in proximal and mid LAD   IMPRESSION: Coronary calcium  score of 122. This was 68 th percentile for age and sex matched control.   Exercise tolerance test May 27, 2019:  Study Highlights  Blood pressure and heart rate demonstrated a normal response to exercise. There was no ST segment deviation noted during stress. Patient had good exercise capacity, exercising for 10 minutes Study stopped for fatigue and atypical chest pain.    IMPRESSION:  1. Nonrheumatic mitral valve regurgitation   2. Primary hypertension   3. Coronary artery disease involving native coronary artery of native heart without angina pectoris   4. Elevated Lp(a)   5. Hyperlipidemia with target LDL less than 50     ASSESSMENT AND PLAN: Ms. Mahaley Schwering is a very pleasant young appearing 53 year-old female who has documented normal systolic and diastolic function with flat mitral valve closure and mild mitral regurgitation by an echo Doppler study in 2012. She was not found to have definitive mitral valve prolapse. She has an intermittent click on exam and a  systolic murmur suggestive of MR.  Due to her significant family history for CAD, a CT cardiac score was done in January 2020 which revealed an elevated calcium  score at 122 which was 99th percentile for age and sex matched control.  Subsequent  exercise treadmill testing demonstrated good exercise tolerance without chest pain development or ECG abnormalities.  Based on her coronary calcification and subclinical atherosclerosis she has been treated with rosuvastatin  20 mg.  Subsequent lipid studies have been excellent with LDLs typically in the mid to upper 50s.   Laboratory from September 19, 2022 showed total cholesterol at 176, triglycerides 112, excellent HDL at 95, and LDL at 58.  She does not have any symptoms of chest pain or exertional dyspnea.  She is tolerating metoprolol  succinate 37.5 mg daily and is unaware of any palpitations.  Her blood pressure has been stable.  At her previous office visit I recommended patient of Zetia , rosuvastatin  and once she was found to have increased LP(a) at 196 it was recommended she further titrate rosuvastatin  and take 40 mg alternating with 20 mg in addition to her Zetia .  She has been interested in super aggressive treatment.  She was evaluated by Christopher Pavero, [facility] and to see if she would qualify for Repatha.  At the time, she did not meet qualification.  Subsequently, she has stopped taking Zetia  and transiently reduced rosuvastatin  to 10 mg.  Subsequent lipid study on February 28, 2024 showed LDL cholesterol at 106 with total cholesterol 218.  I discussed this scenario with Winton Haws who had spoke with her previously concerning Repatha certification.  She will try to precertified her for initiation of therapy and will notify her in MyChart message regarding the result.  In the interim, she is now on rosuvastatin  20 mg and I suggested she can reinitiate Zetia  until she hears regarding the results.  If she is not approved, I would recommend Zetia  with  rosuvastatin  at 40 mg with target LDL less than 50 particularly with her significant LP(a) elevation.  She is aware of my upcoming retirement.  Her mother has extensive peripheral vascular disease in addition to her CAD which I taken care of for years and now sees Dr. Franky Ivanoff for PVD.  I discussed Dr. Alvenia Aus as a option for her and her husband but also discussed transition to care of both she and her husband to Dr. Carson Clara with follow-up in approximately 6 months or sooner as indicated   Millicent Ally, MD, [facility]  03/24/2024 3:58 PM

## 2024-03-24 NOTE — Telephone Encounter (Signed)
 Please do PA for Repatha

## 2024-03-25 ENCOUNTER — Telehealth: Payer: Self-pay | Admitting: Pharmacy Technician

## 2024-03-25 ENCOUNTER — Other Ambulatory Visit (HOSPITAL_COMMUNITY): Payer: Self-pay

## 2024-03-25 DIAGNOSIS — E785 Hyperlipidemia, unspecified: Secondary | ICD-10-CM

## 2024-03-25 NOTE — Telephone Encounter (Signed)
 Pharmacy Patient Advocate Encounter   Received notification from Pt Calls Messages that prior authorization for Repatha is required/requested.   Insurance verification completed.   The patient is insured through [employer] .   Per test claim: PA required; PA submitted to above mentioned insurance via CoverMyMeds Key/confirmation #/EOC [id number] Status is pending

## 2024-03-26 ENCOUNTER — Other Ambulatory Visit (HOSPITAL_COMMUNITY): Payer: Self-pay

## 2024-03-26 MED ORDER — REPATHA SURECLICK 140 MG/ML ~~LOC~~ SOAJ: 140.0000 mg | SUBCUTANEOUS | 3 refills | Status: AC

## 2024-03-26 NOTE — Addendum Note (Signed)
 Addended by: Janean Eischen L on: 03/26/2024 04:50 PM   Modules accepted: Orders

## 2024-03-26 NOTE — Telephone Encounter (Signed)
 Pharmacy Patient Advocate Encounter  Received notification from [employer] that Prior Authorization for Repatha has been APPROVED from 03/25/24 to 03/25/25. Ran test claim, Copay is $40.00- one month. This test claim was processed through [facility]- copay amounts may vary at other pharmacies due to pharmacy/plan contracts, or as the patient moves through the different stages of their insurance plan.   PA #/Case ID/Reference #: [id number]

## 2024-03-26 NOTE — Telephone Encounter (Signed)
 Spoke with patient, reviewed approval and use of medication for LDL lowering.  Will repeat labs (lipid, La(a)) in 3 months.

## 2024-05-22 ENCOUNTER — Telehealth: Payer: Self-pay | Admitting: Pharmacist Clinician (PhC)/ Clinical Pharmacy Specialist

## 2024-05-22 NOTE — Telephone Encounter (Signed)
 Pt would like to know if ok to take 2 days earlier than normal due to going on vacation and also is having bruising from injection. Requesting cb to discuss

## 2024-05-23 NOTE — Telephone Encounter (Signed)
 Spoke with patient.  Okay to do injection 2 days early.  As for bruising, she notes that was about dime sized.  Advised that this is not a concern.  She wishes to move injection to inner thigh rather than abdomen.

## 2024-08-07 ENCOUNTER — Ambulatory Visit: Payer: Self-pay | Admitting: Pharmacist Clinician (PhC)/ Clinical Pharmacy Specialist

## 2024-08-07 LAB — LIPID PANEL
Chol/HDL Ratio: 1.4 ratio (ref 0.0–4.4)
Cholesterol, Total: 153 mg/dL (ref 100–199)
HDL: 112 mg/dL (ref >39)
LDL Chol Calc (NIH): 30 mg/dL (ref 0–99)
Triglycerides: 48 mg/dL (ref 0–149)
VLDL Cholesterol Cal: 11 mg/dL (ref 5–40)

## 2024-08-07 LAB — LIPOPROTEIN A (LPA): Lipoprotein (a): 166 nmol/L — ABNORMAL HIGH (ref <75.0)

## 2024-08-21 ENCOUNTER — Ambulatory Visit: Payer: Self-pay | Admitting: *Deleted

## 2024-11-25 NOTE — Progress Notes (Unsigned)
 " Cardiology Office Note:    Date:  11/26/2024   ID:  Natalie Hooper, DOB [birth year], MRN [id number]  PCP:  Valora Lynwood FALCON, MD  Cardiologist:  None  Electrophysiologist:  None   Referring MD: Valora Lynwood FALCON, MD   Chief Complaint  Patient presents with   Coronary Artery Disease    History of Present Illness:    Natalie Hooper is a 54 y.o. female with a hx of CAD, hyperlipidemia who presents for follow-up.  Previously followed with Dr. Burnard.  Calcium  score 11/13/2018 was 122 (99th percentile).  ETT 05/27/2019 showed good exercise capacity (12.9 METS), no evidence of ischemia.  Coronary CT 09/03/2023 showed nonobstructive CAD, calcium  score 227 (98th percentile).  Since last clinic visit, she reports she is doing well.  Denies any chest pain, dyspnea, lightheadedness, syncope, lower extremity edema, or palpitations.  She remains very active, exercises every day with walking or lifting weights or running.  Does report having issues with epistaxis, occurs weekly.   BP Readings from Last 3 Encounters:  11/26/24 (!) 140/87  03/24/24 124/72  11/14/23 104/78      Past Medical History:  Diagnosis Date   Arrhythmia    Hx of Palp.   Hypertension    Mitral regurgitation    Palpitations    Unilateral inguinal hernia     Past Surgical History:  Procedure Laterality Date   COLONOSCOPY WITH PROPOFOL  N/A 03/18/2020   Procedure: COLONOSCOPY WITH PROPOFOL ;  Surgeon: Toledo, Ladell POUR, MD;  Location: [facility] ENDOSCOPY;  Service: Gastroenterology;  Laterality: N/A;   DILATION AND CURETTAGE OF UTERUS     DOBUTAMINE  STRESS ECHO  05/22/2011   Normal treadmill,normal stress echo   DOPPLER ECHOCARDIOGRAPHY  10/03/2011   EF = >55%,mild mitral regurg   ESOPHAGOGASTRODUODENOSCOPY (EGD) WITH PROPOFOL  N/A 03/18/2020   Procedure: ESOPHAGOGASTRODUODENOSCOPY (EGD) WITH PROPOFOL ;  Surgeon: Toledo, Ladell POUR, MD;  Location: [facility] ENDOSCOPY;  Service: Gastroenterology;  Laterality: N/A;   Event Monitor   06/24/2008   NSR,Sinus Tach   INTRAUTERINE DEVICE (IUD) INSERTION     mirena  inserted 07-25-18   MYOMECTOMY  2011   TONSILLECTOMY     age 45    Current Medications: Active Medications[1]   Allergies:   Iodinated contrast media   Social History   Socioeconomic History   Marital status: Married    Spouse name: Not on file   Number of children: Not on file   Years of education: Not on file   Highest education level: Not on file  Occupational History   Not on file  Tobacco Use   Smoking status: Former    Current packs/day: 0.00    Types: Cigarettes    Start date: 12/11/1991    Quit date: 12/10/1996    Years since quitting: 27.9   Smokeless tobacco: Never  Vaping Use   Vaping status: Never Used  Substance and Sexual Activity   Alcohol use: Yes    Comment: social beer and wine   Drug use: No   Sexual activity: Yes    Partners: Male    Birth control/protection: I.U.D.    Comment: intercourse age 47, less than 5 sexual partners,des neg  Other Topics Concern   Not on file  Social History Narrative   Not on file   Social Drivers of Health   Tobacco Use: Medium Risk (09/29/2024)   Received from [facility]   Patient History    Smoking Tobacco Use: Former    Smokeless Tobacco Use:  Never    Passive Exposure: Not on file  Financial Resource Strain: Low Risk  (09/29/2024)   Received from [facility]   Overall Financial Resource Strain (CARDIA)    Difficulty of Paying Living Expenses: Not hard at all  Food Insecurity: No Food Insecurity (09/29/2024)   Received from [facility]   Epic    Within the past 12 months, you worried that your food would run out before you got the money to buy more.: Never true    Within the past 12 months, the food you bought just didn't last and you didn't have money to get more.: Never true  Transportation Needs: No Transportation Needs (09/29/2024)   Received from [facility] - Transportation    In the past 12 months, has lack of transportation kept you from medical appointments or from getting medications?: No    Lack of Transportation (Non-Medical): No  Physical Activity: Not on file  Stress: Not on file  Social Connections: Not on file  Depression (EYV7-0): Not on file  Alcohol Screen: Not on file  Housing: Low Risk  (09/29/2024)   Received from [facility]   Epic    In the last 12 months, was there a time when you were not able to pay the mortgage or rent on time?: No    In the past 12 months, how many times have you moved where you were living?: 0    At any time in the past 12 months, were you homeless or living in a shelter (including now)?: No  Utilities: Not At Risk (09/29/2024)   Received from [facility]   Epic    In the past 12 months has the electric, gas, oil, or water company threatened to shut off services in your home?: No  Health Literacy: Not on file     Family History: The patient's family history includes Breast cancer in her maternal grandmother; Colon cancer in her maternal uncle; Diabetes in her maternal aunt and maternal grandfather; Heart disease in her maternal grandmother and maternal uncle; Heart disease (age of onset: 47) in her maternal grandfather; Heart disease (age of onset: 62) in her mother; Hypertension in her maternal grandfather, maternal grandmother, and mother; Leukemia in her mother; Lung cancer in her mother; Other in her mother.  ROS:   Please see the history of present illness.     All other systems reviewed and are negative.  EKGs/Labs/Other Studies Reviewed:    The following studies were reviewed today:   EKG:   11/26/2024: Normal sinus rhythm, incomplete right bundle branch block, rate 69  Recent Labs: 02/28/2024: ALT 20; BUN 16; Creatinine, Ser 0.81; Potassium 4.4; Sodium 137  Recent Lipid Panel    Component Value Date/Time   CHOL 153 08/06/2024  0839   TRIG 48 08/06/2024 0839   HDL 112 08/06/2024 0839   CHOLHDL 1.4 08/06/2024 0839   LDLCALC 30 08/06/2024 0839    Physical Exam:    VS:  BP (!) 140/87 (BP Location: Left Arm)   Pulse 69   Ht 5' 2 (1.575 m)   Wt 113 lb (51.3 kg)   SpO2 96%   BMI 20.67 kg/m     Wt Readings from Last 3 Encounters:  11/26/24 113 lb (51.3 kg)  03/24/24 109 lb 3.2 oz (49.5 kg)  11/14/23 114 lb 9.6 oz (52 kg)     GEN:  Well nourished, well developed in  no acute distress HEENT: Normal NECK: No JVD; No carotid bruits LYMPHATICS: No lymphadenopathy CARDIAC: RRR, no murmurs, rubs, gallops RESPIRATORY:  Clear to auscultation without rales, wheezing or rhonchi  ABDOMEN: Soft, non-tender, non-distended MUSCULOSKELETAL:  No edema; No deformity  SKIN: Warm and dry NEUROLOGIC:  Alert and oriented x 3 PSYCHIATRIC:  Normal affect   ASSESSMENT:    1. Coronary artery disease involving native coronary artery of native heart without angina pectoris   2. Primary hypertension   3. Hyperlipidemia, unspecified hyperlipidemia type   4. Epistaxis    PLAN:    CAD: Calcium  score 11/13/2018 was 122 (99th percentile).  ETT 05/27/2019 showed good exercise capacity (12.9 METS), no evidence of ischemia.  Coronary CT 09/03/2023 showed nonobstructive CAD, calcium  score 227 (98th percentile). - Continue Repatha  and rosuvastatin  20 mg daily - Continue aspirin 81 mg daily  Hyperlipidemia: On Repatha , rosuvastatin  20 mg daily.  LDL 24 in 09/2024. Lp(a) 196 on 12/2022, improved to 166 on 08/2024  Hypertension: On toprol  XL 37.5 mg daily.  BP elevated in clinic today, asked to check BP twice daily for next week and let us  know results  Epistaxis: Refer to ENT  RTC in 1 year   Medication Adjustments/Labs and Tests Ordered: Current medicines are reviewed at length with the patient today.  Concerns regarding medicines are outlined above.  Orders Placed This Encounter  Procedures   Ambulatory referral to ENT   EKG  12-Lead   No orders of the defined types were placed in this encounter.   Patient Instructions  Medication Instructions:  Your physician recommends that you continue on your current medications as directed. Please refer to the Current Medication list given to you today.  *If you need a refill on your cardiac medications before your next appointment, please call your pharmacy*  Lab Work: none If you have labs (blood work) drawn today and your tests are completely normal, you will receive your results only by: MyChart Message (if you have MyChart) OR A paper copy in the mail If you have any lab test that is abnormal or we need to change your treatment, we will call you to review the results.  Testing/Procedures: none  Follow-Up: At [facility], you and your health needs are our priority.  As part of our continuing mission to provide you with exceptional heart care, our providers are all part of one team.  This team includes your primary Cardiologist (physician) and Advanced Practice Providers or APPs (Physician Assistants and Nurse Practitioners) who all work together to provide you with the care you need, when you need it.  Your next appointment:   1 year  Provider:   Dr. KATE  We recommend signing up for the patient portal called MyChart.  Sign up information is provided on this After Visit Summary.  MyChart is used to connect with patients for Virtual Visits (Telemedicine).  Patients are able to view lab/test results, encounter notes, upcoming appointments, etc.  Non-urgent messages can be sent to your provider as well.   To learn more about what you can do with MyChart, go to [email or web address].   Other Instructions Referral to ENT            Signed, Lonni LITTIE Kate, MD  11/26/2024 5:21 PM    [facility]     [1]  Current Meds  Medication Sig   acetaminophen (TYLENOL) 325 MG tablet Take 650 mg by mouth as needed.    aspirin EC 81  MG tablet Take 81 mg by mouth.   estradiol (ESTRACE) 0.01 % CREA vaginal cream Place vaginally once a week.   Evolocumab  (REPATHA  SURECLICK) 140 MG/ML SOAJ Inject 140 mg into the skin every 14 (fourteen) days.   ibuprofen (ADVIL,MOTRIN) 200 MG tablet Take 200 mg by mouth as needed.   levonorgestrel  (MIRENA ) 20 MCG/24HR IUD 1 each by Intrauterine route once.   metoprolol  succinate (TOPROL -XL) 25 MG 24 hr tablet TAKE 1.5 TABLETS BY MOUTH DAILY.   Multiple Vitamin (MULTI-VITAMIN) tablet Take 1 tablet by mouth daily.   omeprazole (PRILOSEC) 20 MG capsule Take 1 capsule by mouth as needed. Pt takes 1 tablet daily (Patient taking differently: Take 1 capsule by mouth daily. Pt takes 1 tablet daily)   rosuvastatin  (CRESTOR ) 20 MG tablet Take 20 mg by mouth daily.   "

## 2024-11-26 ENCOUNTER — Ambulatory Visit: Payer: Self-pay | Admitting: Cardiology

## 2024-11-26 VITALS — BP 140/87 | HR 69 | Ht 62.0 in | Wt 113.0 lb

## 2024-11-26 DIAGNOSIS — E785 Hyperlipidemia, unspecified: Secondary | ICD-10-CM

## 2024-11-26 DIAGNOSIS — I2510 Atherosclerotic heart disease of native coronary artery without angina pectoris: Secondary | ICD-10-CM

## 2024-11-26 DIAGNOSIS — R040 Epistaxis: Secondary | ICD-10-CM | POA: Diagnosis not present

## 2024-11-26 DIAGNOSIS — I10 Essential (primary) hypertension: Secondary | ICD-10-CM | POA: Diagnosis not present

## 2024-11-26 NOTE — Patient Instructions (Signed)
 Medication Instructions:  Your physician recommends that you continue on your current medications as directed. Please refer to the Current Medication list given to you today.  *If you need a refill on your cardiac medications before your next appointment, please call your pharmacy*  Lab Work: none If you have labs (blood work) drawn today and your tests are completely normal, you will receive your results only by: MyChart Message (if you have MyChart) OR A paper copy in the mail If you have any lab test that is abnormal or we need to change your treatment, we will call you to review the results.  Testing/Procedures: none  Follow-Up: At [facility], you and your health needs are our priority.  As part of our continuing mission to provide you with exceptional heart care, our providers are all part of one team.  This team includes your primary Cardiologist (physician) and Advanced Practice Providers or APPs (Physician Assistants and Nurse Practitioners) who all work together to provide you with the care you need, when you need it.  Your next appointment:   1 year  Provider:   Dr. KATE  We recommend signing up for the patient portal called MyChart.  Sign up information is provided on this After Visit Summary.  MyChart is used to connect with patients for Virtual Visits (Telemedicine).  Patients are able to view lab/test results, encounter notes, upcoming appointments, etc.  Non-urgent messages can be sent to your provider as well.   To learn more about what you can do with MyChart, go to [email or web address].   Other Instructions Referral to ENT

## 2024-12-03 ENCOUNTER — Encounter: Payer: Self-pay | Admitting: Cardiology

## 2024-12-04 NOTE — Telephone Encounter (Signed)
 Do we have a denial for Repatha  from the new insurance?  If not can you do a PA.  Once we get the denial I can write an appeal.  Thank you

## 2024-12-05 ENCOUNTER — Telehealth: Payer: Self-pay | Admitting: Pharmacy Technician

## 2024-12-05 ENCOUNTER — Other Ambulatory Visit (HOSPITAL_COMMUNITY): Payer: Self-pay

## 2024-12-05 NOTE — Telephone Encounter (Addendum)
" ° °  Pharmacy Patient Advocate Encounter   Received notification from Patient Advice Request messages that prior authorization for REPATHA  is required/requested.   Insurance verification completed.   The patient is insured through [employer].   Per test claim: PA required; PA submitted to above mentioned insurance via Latent Key/confirmation #/EOC [id number] Status is pending    Insurance wants praluent but the patient wants repatha . Sent pa for repatha  "

## 2024-12-08 NOTE — Telephone Encounter (Signed)
 Hi, insurance is wanting praluent.. would you like for me to try a prior authorization for praluent or you want to do an appeal for repatha ?   Denial scanned in
